# Patient Record
Sex: Male | Born: 1975 | Race: White | Hispanic: No | Marital: Married | Smoking: Current some day smoker
Health system: Southern US, Community
[De-identification: ages and names within clinical notes are randomized; demographics above are authoritative.]

## PROBLEM LIST (undated history)

## (undated) DIAGNOSIS — E1165 Type 2 diabetes mellitus with hyperglycemia: Secondary | ICD-10-CM

## (undated) DIAGNOSIS — G4733 Obstructive sleep apnea (adult) (pediatric): Secondary | ICD-10-CM

## (undated) DIAGNOSIS — E783 Hyperchylomicronemia: Secondary | ICD-10-CM

## (undated) DIAGNOSIS — K259 Gastric ulcer, unspecified as acute or chronic, without hemorrhage or perforation: Secondary | ICD-10-CM

## (undated) DIAGNOSIS — S82899A Other fracture of unspecified lower leg, initial encounter for closed fracture: Secondary | ICD-10-CM

## (undated) DIAGNOSIS — E119 Type 2 diabetes mellitus without complications: Secondary | ICD-10-CM

## (undated) DIAGNOSIS — E785 Hyperlipidemia, unspecified: Secondary | ICD-10-CM

## (undated) DIAGNOSIS — R1011 Right upper quadrant pain: Secondary | ICD-10-CM

## (undated) DIAGNOSIS — I10 Essential (primary) hypertension: Secondary | ICD-10-CM

## (undated) DIAGNOSIS — K219 Gastro-esophageal reflux disease without esophagitis: Secondary | ICD-10-CM

## (undated) HISTORY — DX: Hyperchylomicronemia: E78.3

## (undated) HISTORY — DX: Hyperlipidemia, unspecified: E78.5

## (undated) HISTORY — DX: Obstructive sleep apnea (adult) (pediatric): G47.33

## (undated) HISTORY — DX: Essential (primary) hypertension: I10

## (undated) HISTORY — PX: KNEE SURGERY: SHX244

## (undated) HISTORY — DX: Gastric ulcer, unspecified as acute or chronic, without hemorrhage or perforation: K25.9

## (undated) HISTORY — DX: Type 2 diabetes mellitus without complications: E11.9

## (undated) HISTORY — DX: Right upper quadrant pain: R10.11

## (undated) HISTORY — DX: Type 2 diabetes mellitus with hyperglycemia: E11.65

## (undated) HISTORY — DX: Gastro-esophageal reflux disease without esophagitis: K21.9

---

## 1999-11-09 HISTORY — PX: CHOLECYSTECTOMY: SHX55

## 2011-05-06 ENCOUNTER — Observation Stay: Payer: Self-pay | Admitting: Internal Medicine

## 2014-05-30 ENCOUNTER — Ambulatory Visit: Payer: Self-pay | Admitting: Gastroenterology

## 2014-05-31 LAB — PATHOLOGY REPORT

## 2014-06-26 ENCOUNTER — Ambulatory Visit: Payer: Self-pay | Admitting: Gastroenterology

## 2014-07-25 ENCOUNTER — Emergency Department: Payer: Self-pay | Admitting: Emergency Medicine

## 2014-07-25 LAB — BASIC METABOLIC PANEL
Anion Gap: 7 (ref 7–16)
BUN: 10 mg/dL (ref 7–18)
Calcium, Total: 9.2 mg/dL (ref 8.5–10.1)
Chloride: 101 mmol/L (ref 98–107)
Co2: 25 mmol/L (ref 21–32)
Creatinine: 0.89 mg/dL (ref 0.60–1.30)
EGFR (African American): >60
EGFR (Non-African Amer.): >60
Glucose: 231 mg/dL — ABNORMAL HIGH (ref 65–99)
Osmolality: 273 (ref 275–301)
Potassium: 3.9 mmol/L (ref 3.5–5.1)
Sodium: 133 mmol/L — ABNORMAL LOW (ref 136–145)

## 2014-07-25 LAB — CBC
HCT: 46.4 % (ref 40.0–52.0)
HGB: 15.4 g/dL (ref 13.0–18.0)
MCH: 30.9 pg (ref 26.0–34.0)
MCHC: 33.1 g/dL (ref 32.0–36.0)
MCV: 93 fL (ref 80–100)
PLATELETS: 191 10*3/uL (ref 150–440)
RBC: 4.98 10*6/uL (ref 4.40–5.90)
RDW: 12.7 % (ref 11.5–14.5)
WBC: 9.2 10*3/uL (ref 3.8–10.6)

## 2014-07-25 LAB — TROPONIN I

## 2014-07-25 LAB — LIPASE, BLOOD: LIPASE: 244 U/L (ref 73–393)

## 2014-07-26 LAB — HEMOGLOBIN A1C: HEMOGLOBIN A1C: 7.3 % — AB (ref 4.2–6.3)

## 2014-08-06 DIAGNOSIS — E783 Hyperchylomicronemia: Secondary | ICD-10-CM

## 2014-08-06 DIAGNOSIS — E1165 Type 2 diabetes mellitus with hyperglycemia: Secondary | ICD-10-CM

## 2014-08-06 DIAGNOSIS — Z720 Tobacco use: Secondary | ICD-10-CM | POA: Insufficient documentation

## 2014-08-06 DIAGNOSIS — R1011 Right upper quadrant pain: Secondary | ICD-10-CM

## 2014-08-06 DIAGNOSIS — I10 Essential (primary) hypertension: Secondary | ICD-10-CM

## 2014-08-06 DIAGNOSIS — E785 Hyperlipidemia, unspecified: Secondary | ICD-10-CM | POA: Insufficient documentation

## 2014-08-06 HISTORY — DX: Hyperlipidemia, unspecified: E78.5

## 2014-08-06 HISTORY — DX: Hyperchylomicronemia: E78.3

## 2014-08-06 HISTORY — DX: Essential (primary) hypertension: I10

## 2014-08-06 HISTORY — DX: Type 2 diabetes mellitus with hyperglycemia: E11.65

## 2014-08-06 HISTORY — DX: Right upper quadrant pain: R10.11

## 2014-08-20 ENCOUNTER — Ambulatory Visit: Payer: Self-pay | Admitting: Physician Assistant

## 2014-08-28 ENCOUNTER — Ambulatory Visit: Payer: Self-pay | Admitting: Physician Assistant

## 2014-09-05 DIAGNOSIS — E119 Type 2 diabetes mellitus without complications: Secondary | ICD-10-CM

## 2014-09-05 HISTORY — DX: Type 2 diabetes mellitus without complications: E11.9

## 2014-09-08 ENCOUNTER — Ambulatory Visit: Payer: Self-pay | Admitting: Physician Assistant

## 2015-12-26 ENCOUNTER — Encounter: Payer: Self-pay | Admitting: Physician Assistant

## 2016-01-13 ENCOUNTER — Ambulatory Visit (INDEPENDENT_AMBULATORY_CARE_PROVIDER_SITE_OTHER): Payer: 59 | Admitting: Urology

## 2016-01-13 ENCOUNTER — Encounter: Payer: Self-pay | Admitting: Urology

## 2016-01-13 VITALS — BP 119/79 | HR 72 | Ht 70.0 in | Wt 244.0 lb

## 2016-01-13 DIAGNOSIS — E291 Testicular hypofunction: Secondary | ICD-10-CM

## 2016-01-13 DIAGNOSIS — G4733 Obstructive sleep apnea (adult) (pediatric): Secondary | ICD-10-CM

## 2016-01-13 HISTORY — DX: Obstructive sleep apnea (adult) (pediatric): G47.33

## 2016-01-13 MED ORDER — SILDENAFIL CITRATE 20 MG PO TABS: 60.0000 mg | ORAL_TABLET | Freq: Every day | ORAL | Status: DC | PRN

## 2016-01-13 NOTE — Progress Notes (Signed)
01/13/2016 9:52 AM   Synetta Shadow [id number] [id number]  Referring provider: No referring provider defined for this encounter.  Chief Complaint  Patient presents with  . Erectile Dysfunction    New Patient    HPI: 40 year old male who is referred for erectile dysfunction. The patient has had some worsening erectile function over the past 3 or 4 years. He has in the past responded to Viagra 100 mg. However recently, he has responded less vigorously. He is able to obtain an erection using Viagra but often it is not durable enough. He rarely is able to ejaculate. The patient states that this situation is really affecting his relationship with his wife and he mentioned this leading to a divorce potentially. The patient has one 66-year-old son, they were interested in having more children potentially in the future. The patient states that he has a strong libido, does have some lethargy and fatigue symptoms. He has had his testosterone checked several times. The most recent one was 254. This was checked in the morning.   The patient was started on Wellbutrin about 2 weeks ago for depression and anxiety. The patient has had quite a bit of anxiety as it relates to his erectile dysfunction and is overall health given his young age. He has been a type II diabetic for the past 1 year, last A1c was 6.6 %.  The patient states that he walks and watches his diet, he does not do any formal exercise. He is a current smoker. His weight has been a constant struggle, however recently he has gained 10 pounds or so. The patient has obstructive sleep apnea, uses C Pap machine which he has been compliant with. He has no progressive voiding symptoms.   PMH: Past Medical History  Diagnosis Date  . BP (high blood pressure) 08/06/2014  . Obstructive apnea 01/13/2016  . Controlled type 2 diabetes mellitus without complication (HCC) 09/05/2014  . Abdominal pain, right upper quadrant 08/06/2014  . 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 HLD (hyperlipidemia) 08/06/2014  . Type 2 diabetes mellitus with hyperglycemia (HCC) 08/06/2014  . Stomach ulcer   . GERD (gastroesophageal reflux disease)     Surgical History: Past Surgical History  Procedure Laterality Date  . Cholecystectomy  2001  . Knee surgery  7829,56,21    Home Medications:    Medication List       This list is accurate as of: 01/13/16  9:52 AM.  Always use your most recent med list.               benazepril-hydrochlorthiazide 20-25 MG tablet  Commonly known as:  LOTENSIN HCT  Take 1 tablet by mouth daily.     buPROPion 150 MG 24 hr tablet  Commonly known as:  WELLBUTRIN XL  TAKE 1 TABLET (150 MG TOTAL) BY MOUTH ONCE DAILY.     fenofibrate 160 MG tablet  TAKE 1 TABLET (160 MG TOTAL) BY MOUTH ONCE DAILY.     Fish Oil 1000 MG Caps  Take by mouth.     metFORMIN 500 MG tablet  Commonly known as:  GLUCOPHAGE  TAKE ONE TAB IN MORNING AND TWO TABS AT SUPPER.     MULTIVITAMIN ADULT PO  Take by mouth.     pantoprazole 40 MG tablet  Commonly known as:  PROTONIX  TAKE 1 TABLET (40 MG TOTAL) BY MOUTH ONCE DAILY.        Allergies: No Known Allergies  Family History: Family History  Problem  Relation Age of Onset  . Adopted: Yes    Social History:  reports that he has been smoking Cigarettes.  He has been smoking about 0.25 packs per day. He does not have any smokeless tobacco history on file. He reports that he drinks alcohol. He reports that he does not use illicit drugs.  ROS: UROLOGY Frequent Urination?: No Hard to postpone urination?: No Burning/pain with urination?: No Get up at night to urinate?: No Leakage of urine?: No Urine stream starts and stops?: No Trouble starting stream?: No Do you have to strain to urinate?: No Blood in urine?: No Urinary tract infection?: No Sexually transmitted disease?: No Injury to kidneys or bladder?: No Painful intercourse?: No Weak stream?:  No Erection problems?: Yes Penile pain?: No  Gastrointestinal Nausea?: No Vomiting?: No Indigestion/heartburn?: Yes Diarrhea?: No Constipation?: No  Constitutional Fever: No Night sweats?: No Weight loss?: No Fatigue?: No  Skin Skin rash/lesions?: No Itching?: Yes  Eyes Blurred vision?: No Double vision?: No  Ears/Nose/Throat Sore throat?: No Sinus problems?: No  Hematologic/Lymphatic Swollen glands?: No Easy bruising?: No  Cardiovascular Leg swelling?: No Chest pain?: No  Respiratory Cough?: No Shortness of breath?: No  Endocrine Excessive thirst?: Yes  Musculoskeletal Back pain?: No Joint pain?: Yes  Neurological Headaches?: No Dizziness?: No  Psychologic Depression?: No Anxiety?: No  Physical Exam: BP 119/79 mmHg  Pulse 72  Ht 5\' 10"  (1.778 m)  Wt 244 lb (110.678 kg)  BMI 35.01 kg/m2  Constitutional:  Alert and oriented, No acute distress. HEENT: [place] AT, moist mucus membranes.  Trachea midline, no masses. Cardiovascular: No clubbing, cyanosis, or edema. Respiratory: Normal respiratory effort, no increased work of breathing. GI: Abdomen is soft, nontender, nondistended, no abdominal masses GU: No CVA tenderness. Testicles are normal in size, no masses palpable. The epididymis are normal to palpation. No tenderness. No scrotal lesions. Rectal exam was deferred Skin: No rashes, bruises or suspicious lesions. Lymph: No cervical or inguinal adenopathy. Neurologic: Grossly intact, no focal deficits, moving all 4 extremities. Psychiatric: Normal mood and affect.  Laboratory Data: Lab Results  Component Value Date   WBC 9.2 07/25/2014   HGB 15.4 07/25/2014   HCT 46.4 07/25/2014   MCV 93 07/25/2014   PLT 191 07/25/2014    Lab Results  Component Value Date   CREATININE 0.89 07/25/2014    No results found for: PSA  No results found for: TESTOSTERONE  Lab Results  Component Value Date   HGBA1C 7.3* 07/25/2014    Urinalysis No  results found for: COLORURINE, APPEARANCEUR, LABSPEC, PHURINE, GLUCOSEU, HGBUR, BILIRUBINUR, KETONESUR, PROTEINUR, UROBILINOGEN, NITRITE, LEUKOCYTESUR  Pertinent Imaging: None  Assessment & Plan:  The patient has erectile dysfunction which is likely multifactorial. He likely has a psychogenic component, arterial insufficiency component, and a endocrine component. Given his struggles with his relationship and his overall degree of frustration, I think it is worth pursuing all 3 modalities.  So, in order to get insurance to cover any form of testosterone therapy we will need to obtain a second level that is low. In addition, I have ordered other labs to violate the entire endocrine access. I also added an estrogen to see how much his obesity is playing into his low testosterone. Given that the patient is still interested in having children, we discussed the best mode of therapy which in him would likely be Clomid or aromatase inhibitors. I did caution him that this may not lead to any erectile function improvement but we could certainly try. In  addition to medical therapy I urged the patient to begin a structured and regimented exercise routine so that he is able to get at least 30 minutes of cardiovascular exercise 3 or 4 times a week. Losing weight would help his confidence, reduce the estrogen levels, and improve his overall well-being. Smoking cessation is also a big factor and I encouraged him to continue weaning himself tobacco. And finally, I recommended the patient used silk then a fill to help augment his erections. I sent a prescription to the pharmacy for him and recommend that he take between 60 and 100 mg of sildenafil an hour before or after food. We also discussed the proper use of sildenafil in terms of timing. I also explained to him the side effects which she is familiar with.   1. Hypogonadism in male - we will check the labs listed and if the patient is truly hypogonadal primarily I  recommend that we start the patient on Clomid 50 mg daily. We'll then follow up with him in 6 weeks with labs prior. Again, sildenafil was sent to the pharmacy for him as well. - Testosterone; Future - FSH/LH; Future - Prolactin; Future - Hemoglobin; Future - Estradiol; Future - PSA; Future   Return in about 6 weeks (around 02/24/2016) for f/u of hypogonadism.  Crist Fat, MD  [facility] [address] [place], [place] [address] [phone]

## 2016-01-15 ENCOUNTER — Other Ambulatory Visit: Payer: 59

## 2016-01-15 DIAGNOSIS — E291 Testicular hypofunction: Secondary | ICD-10-CM

## 2016-01-16 LAB — TESTOSTERONE: TESTOSTERONE: 169 ng/dL — AB (ref 348–1197)

## 2016-01-16 LAB — HEMOGLOBIN: Hemoglobin: 15.6 g/dL (ref 12.6–17.7)

## 2016-01-16 LAB — FSH/LH
FSH: 6.6 m[IU]/mL (ref 1.5–12.4)
LH: 7.1 m[IU]/mL (ref 1.7–8.6)

## 2016-01-16 LAB — PROLACTIN: PROLACTIN: 10.2 ng/mL (ref 4.0–15.2)

## 2016-01-16 LAB — ESTRADIOL: Estradiol: 10.4 pg/mL (ref 7.6–42.6)

## 2016-01-16 LAB — PSA: Prostate Specific Ag, Serum: 0.7 ng/mL (ref 0.0–4.0)

## 2016-01-21 ENCOUNTER — Telehealth: Payer: Self-pay | Admitting: Urology

## 2016-01-21 NOTE — Telephone Encounter (Signed)
Patient was seen on 01-15-16 and is still waiting on his lab results. He would like a call back today please.  Thanks  michelle

## 2016-01-22 ENCOUNTER — Telehealth: Payer: Self-pay

## 2016-01-22 DIAGNOSIS — E291 Testicular hypofunction: Secondary | ICD-10-CM

## 2016-01-22 MED ORDER — CLOMIPHENE CITRATE 50 MG PO TABS
50.0000 mg | ORAL_TABLET | Freq: Every day | ORAL | Status: DC
Start: 2016-01-22 — End: 2016-07-15

## 2016-01-22 NOTE — Telephone Encounter (Signed)
Spoke with pt in reference to labs and testosterone. Med[facility] been [facility]. Pt has been made aware.

## 2016-01-22 NOTE — Telephone Encounter (Signed)
-----   Message from Crist FatBenjamin W Herrick, MD sent at 01/21/2016  7:52 PM EDT ----- Regarding: testosterone labs Please call patient and give him the results of his most recent lab work.  His testosterone is low, the other labs looked okay.  Please send a prescription for him to start clomid 50mg  daily.  He should then follow-up with repeat labs in 6 weeks. Thank you, Cordella RegisterBen Herrick

## 2016-02-27 ENCOUNTER — Other Ambulatory Visit: Payer: 59

## 2016-03-02 ENCOUNTER — Ambulatory Visit: Payer: 59

## 2016-03-03 ENCOUNTER — Other Ambulatory Visit: Payer: Self-pay

## 2016-03-03 DIAGNOSIS — E291 Testicular hypofunction: Secondary | ICD-10-CM

## 2016-03-04 ENCOUNTER — Other Ambulatory Visit: Payer: Self-pay

## 2016-03-04 DIAGNOSIS — E291 Testicular hypofunction: Secondary | ICD-10-CM

## 2016-03-05 LAB — TESTOSTERONE: Testosterone: 589 ng/dL (ref 348–1197)

## 2016-03-08 ENCOUNTER — Encounter: Payer: Self-pay | Admitting: Urology

## 2016-03-08 ENCOUNTER — Ambulatory Visit: Payer: 59 | Admitting: Urology

## 2016-07-15 ENCOUNTER — Encounter
Admission: RE | Admit: 2016-07-15 | Discharge: 2016-07-15 | Disposition: A | Discharge status: Home / Self Care | Payer: BLUE CROSS/BLUE SHIELD | Source: Ambulatory Visit | Attending: Surgery | Admitting: Surgery

## 2016-07-15 DIAGNOSIS — I10 Essential (primary) hypertension: Secondary | ICD-10-CM | POA: Diagnosis not present

## 2016-07-15 DIAGNOSIS — Z01810 Encounter for preprocedural cardiovascular examination: Secondary | ICD-10-CM | POA: Diagnosis not present

## 2016-07-15 HISTORY — DX: Other fracture of unspecified lower leg, initial encounter for closed fracture: S82.899A

## 2016-07-15 NOTE — Patient Instructions (Signed)
  Your procedure is scheduled on: 07/23/16 Friday Report to Day Surgery. To find out your arrival time please call [phone] between 1PM - 3PM on Thurs. 07/22/16.  Remember: Instructions that are not followed completely may result in serious medical risk, up to and including death, or upon the discretion of your surgeon and anesthesiologist your surgery may need to be rescheduled.    __x__ 1. Do not eat food or drink liquids after midnight. No gum chewing or hard candies.     __x__ 2. No Alcohol for 24 hours before or after surgery.   __x__ 3. Do Not Smoke For 24 Hours Prior to Your Surgery.   ____ 4. Bring all medications with you on the day of surgery if instructed.    __x__ 5. Notify your doctor if there is any change in your medical condition     (cold, fever, infections).       Do not wear jewelry, make-up, hairpins, clips or nail polish.  Do not wear lotions, powders, or perfumes. You may wear deodorant.  Do not shave 48 hours prior to surgery. Men may shave face and neck.  Do not bring valuables to the hospital.    [facility] is not responsible for any belongings or valuables.               Contacts, dentures or bridgework may not be worn into surgery.  Leave your suitcase in the car. After surgery it may be brought to your room.  For patients admitted to the hospital, discharge time is determined by your                treatment team.   Patients discharged the day of surgery will not be allowed to drive home.   Please read over the following fact sheets that you were given:   Surgical Site Infection Prevention   _x___ Take these medicines the morning of surgery with A SIP OF WATER:    1. benazepril-hydrochlorthiazide (LOTENSIN HCT) 20-25 MG tablet (Bring to hospital)  2. buPROPion (WELLBUTRIN XL) 150 MG 24 hr tablet  3. pantoprazole (PROTONIX) 40 MG tablet (Take extra dose the night before)  4.  5.  6.  ____ Fleet Enema (as directed)   ____ Use CHG Soap as  directed  ____ Use inhalers on the day of surgery  __x__ Stop metformin 2 days prior to surgery    ____ Take 1/2 of usual insulin dose the night before surgery and none on the morning of surgery.   ____ Stop Coumadin/Plavix/aspirin   __x__ Stop Anti-inflammatories on tylenol only   __x__ Stop supplements until after surgery.  Stop fish oil tomorrow  __x__ Bring C-Pap to the hospital.

## 2016-07-23 ENCOUNTER — Ambulatory Visit
Admission: RE | Admit: 2016-07-23 | Discharge: 2016-07-23 | Disposition: A | Discharge status: Home / Self Care | Payer: BLUE CROSS/BLUE SHIELD | Source: Ambulatory Visit | Attending: Surgery | Admitting: Surgery

## 2016-07-23 ENCOUNTER — Ambulatory Visit: Payer: BLUE CROSS/BLUE SHIELD | Admitting: Anesthesiology

## 2016-07-23 ENCOUNTER — Encounter: Payer: Self-pay | Admitting: *Deleted

## 2016-07-23 ENCOUNTER — Encounter
Admission: RE | Disposition: A | Discharge status: Home / Self Care | Payer: Self-pay | Source: Ambulatory Visit | Attending: Surgery

## 2016-07-23 DIAGNOSIS — G4733 Obstructive sleep apnea (adult) (pediatric): Secondary | ICD-10-CM | POA: Diagnosis not present

## 2016-07-23 DIAGNOSIS — Z79899 Other long term (current) drug therapy: Secondary | ICD-10-CM | POA: Diagnosis not present

## 2016-07-23 DIAGNOSIS — F419 Anxiety disorder, unspecified: Secondary | ICD-10-CM | POA: Diagnosis not present

## 2016-07-23 DIAGNOSIS — K601 Chronic anal fissure: Secondary | ICD-10-CM | POA: Insufficient documentation

## 2016-07-23 DIAGNOSIS — E119 Type 2 diabetes mellitus without complications: Secondary | ICD-10-CM | POA: Insufficient documentation

## 2016-07-23 DIAGNOSIS — Z9049 Acquired absence of other specified parts of digestive tract: Secondary | ICD-10-CM | POA: Diagnosis not present

## 2016-07-23 DIAGNOSIS — F17200 Nicotine dependence, unspecified, uncomplicated: Secondary | ICD-10-CM | POA: Insufficient documentation

## 2016-07-23 DIAGNOSIS — Z6837 Body mass index (BMI) 37.0-37.9, adult: Secondary | ICD-10-CM | POA: Diagnosis not present

## 2016-07-23 DIAGNOSIS — E785 Hyperlipidemia, unspecified: Secondary | ICD-10-CM | POA: Insufficient documentation

## 2016-07-23 DIAGNOSIS — I10 Essential (primary) hypertension: Secondary | ICD-10-CM | POA: Diagnosis not present

## 2016-07-23 DIAGNOSIS — Z7984 Long term (current) use of oral hypoglycemic drugs: Secondary | ICD-10-CM | POA: Insufficient documentation

## 2016-07-23 DIAGNOSIS — E669 Obesity, unspecified: Secondary | ICD-10-CM | POA: Diagnosis not present

## 2016-07-23 HISTORY — PX: SPHINCTEROTOMY: SHX5279

## 2016-07-23 LAB — GLUCOSE, CAPILLARY
GLUCOSE-CAPILLARY: 189 mg/dL — AB (ref 65–99)
Glucose-Capillary: 199 mg/dL — ABNORMAL HIGH (ref 65–99)

## 2016-07-23 SURGERY — SPHINCTEROTOMY, ANAL
Anesthesia: General | Site: Rectum | Wound class: Clean Contaminated

## 2016-07-23 MED ORDER — ROCURONIUM BROMIDE 100 MG/10ML IV SOLN
INTRAVENOUS | Status: DC | PRN
  Administered 2016-07-23: 25 mg via INTRAVENOUS
  Administered 2016-07-23: 10 mg via INTRAVENOUS
  Administered 2016-07-23: 5 mg via INTRAVENOUS

## 2016-07-23 MED ORDER — BUPIVACAINE-EPINEPHRINE (PF) 0.5% -1:200000 IJ SOLN
INTRAMUSCULAR | Status: DC | PRN
  Administered 2016-07-23: 5 mL via PERINEURAL

## 2016-07-23 MED ORDER — SUCCINYLCHOLINE CHLORIDE 20 MG/ML IJ SOLN
INTRAMUSCULAR | Status: DC | PRN
  Administered 2016-07-23: 120 mg via INTRAVENOUS

## 2016-07-23 MED ORDER — BUPIVACAINE-EPINEPHRINE (PF) 0.5% -1:200000 IJ SOLN
INTRAMUSCULAR | Status: AC
  Filled 2016-07-23: qty 30

## 2016-07-23 MED ORDER — OXYCODONE HCL 5 MG PO TABS
ORAL_TABLET | ORAL | Status: AC
  Filled 2016-07-23: qty 1

## 2016-07-23 MED ORDER — FENTANYL CITRATE (PF) 100 MCG/2ML IJ SOLN
INTRAMUSCULAR | Status: AC
  Administered 2016-07-23: 50 ug
  Filled 2016-07-23: qty 2

## 2016-07-23 MED ORDER — NEOSTIGMINE METHYLSULFATE 10 MG/10ML IV SOLN
INTRAVENOUS | Status: DC | PRN
  Administered 2016-07-23: 3 mg via INTRAVENOUS

## 2016-07-23 MED ORDER — HYDROCODONE-ACETAMINOPHEN 5-325 MG PO TABS: 1.0000 | ORAL_TABLET | ORAL | Status: DC | PRN

## 2016-07-23 MED ORDER — FENTANYL CITRATE (PF) 100 MCG/2ML IJ SOLN
INTRAMUSCULAR | Status: DC | PRN
  Administered 2016-07-23: 50 ug via INTRAVENOUS

## 2016-07-23 MED ORDER — FENTANYL CITRATE (PF) 100 MCG/2ML IJ SOLN
INTRAMUSCULAR | Status: AC
  Filled 2016-07-23: qty 2

## 2016-07-23 MED ORDER — GLYCOPYRROLATE 0.2 MG/ML IJ SOLN
INTRAMUSCULAR | Status: DC | PRN
  Administered 2016-07-23: 0.6 mg via INTRAVENOUS

## 2016-07-23 MED ORDER — FENTANYL CITRATE (PF) 100 MCG/2ML IJ SOLN
25.0000 ug | INTRAMUSCULAR | Status: DC | PRN
  Administered 2016-07-23 (×2): 50 ug via INTRAVENOUS

## 2016-07-23 MED ORDER — MEPERIDINE HCL 25 MG/ML IJ SOLN: 6.2500 mg | INTRAMUSCULAR | Status: DC | PRN

## 2016-07-23 MED ORDER — OXYCODONE HCL 5 MG PO TABS
5.0000 mg | ORAL_TABLET | Freq: Once | ORAL | Status: AC | PRN
  Administered 2016-07-23: 5 mg via ORAL

## 2016-07-23 MED ORDER — PROPOFOL 10 MG/ML IV BOLUS
INTRAVENOUS | Status: DC | PRN
  Administered 2016-07-23: 200 mg via INTRAVENOUS

## 2016-07-23 MED ORDER — ONDANSETRON HCL 4 MG/2ML IJ SOLN
INTRAMUSCULAR | Status: DC | PRN
  Administered 2016-07-23: 4 mg via INTRAVENOUS

## 2016-07-23 MED ORDER — HYDROCODONE-ACETAMINOPHEN 5-325 MG PO TABS: 1.0000 | ORAL_TABLET | ORAL | 0 refills | Status: DC | PRN

## 2016-07-23 MED ORDER — PROMETHAZINE HCL 25 MG/ML IJ SOLN: 6.2500 mg | INTRAMUSCULAR | Status: DC | PRN

## 2016-07-23 MED ORDER — SODIUM CHLORIDE 0.9 % IV SOLN
INTRAVENOUS | Status: DC
  Administered 2016-07-23: 10:00:00 via INTRAVENOUS

## 2016-07-23 MED ORDER — OXYCODONE HCL 5 MG/5ML PO SOLN: 5.0000 mg | Freq: Once | ORAL | Status: AC | PRN

## 2016-07-23 MED ORDER — LIDOCAINE HCL (CARDIAC) 20 MG/ML IV SOLN
INTRAVENOUS | Status: DC | PRN
  Administered 2016-07-23: 40 mg via INTRAVENOUS

## 2016-07-23 MED ORDER — MIDAZOLAM HCL 2 MG/2ML IJ SOLN
INTRAMUSCULAR | Status: DC | PRN
  Administered 2016-07-23: 1.5 mg via INTRAVENOUS
  Administered 2016-07-23: 0.5 mg via INTRAVENOUS

## 2016-07-23 SURGICAL SUPPLY — 26 items
BLADE SURG 15 STRL LF DISP TIS (BLADE) ×1 IMPLANT
BLADE SURG 15 STRL SS (BLADE) ×2
CANISTER SUCT 1200ML W/VALVE (MISCELLANEOUS) ×3 IMPLANT
DRAPE LAPAROTOMY 100X77 ABD (DRAPES) ×3 IMPLANT
DRAPE LEGGINS SURG 28X43 STRL (DRAPES) ×3 IMPLANT
DRAPE UNDER BUTTOCK W/FLU (DRAPES) ×3 IMPLANT
ELECT REM PT RETURN 9FT ADLT (ELECTROSURGICAL) ×3
ELECTRODE REM PT RTRN 9FT ADLT (ELECTROSURGICAL) ×1 IMPLANT
GAUZE SPONGE 4X4 12PLY STRL (GAUZE/BANDAGES/DRESSINGS) ×3 IMPLANT
GLOVE BIO SURGEON STRL SZ7.5 (GLOVE) ×3 IMPLANT
GOWN STRL REUS W/ TWL LRG LVL3 (GOWN DISPOSABLE) ×2 IMPLANT
GOWN STRL REUS W/TWL LRG LVL3 (GOWN DISPOSABLE) ×4
KIT RM TURNOVER CYSTO AR (KITS) ×3 IMPLANT
LABEL OR SOLS (LABEL) ×3 IMPLANT
NEEDLE HYPO 25X1 1.5 SAFETY (NEEDLE) ×3 IMPLANT
NS IRRIG 500ML POUR BTL (IV SOLUTION) ×3 IMPLANT
PACK BASIN MINOR ARMC (MISCELLANEOUS) ×3 IMPLANT
PAD PREP 24X41 OB/GYN DISP (PERSONAL CARE ITEMS) ×3 IMPLANT
SOL PREP PVP 2OZ (MISCELLANEOUS) ×3
SOLUTION PREP PVP 2OZ (MISCELLANEOUS) ×1 IMPLANT
SURGILUBE 2OZ TUBE FLIPTOP (MISCELLANEOUS) ×3 IMPLANT
SUT CHROMIC 3 0 SH 27 (SUTURE) ×3 IMPLANT
SUT CHROMIC 5 0 RB 1 27 (SUTURE) ×6 IMPLANT
SUT VIC AB 3-0 SH 27 (SUTURE) ×2
SUT VIC AB 3-0 SH 27X BRD (SUTURE) ×1 IMPLANT
SYRINGE 10CC LL (SYRINGE) ×3 IMPLANT

## 2016-07-23 NOTE — Anesthesia Procedure Notes (Signed)
Procedure Name: Intubation Date/Time: 07/23/2016 9:54 AM Performed by: Kevin BusmanIAMOND, Kevin Maynard Pre-anesthesia Checklist: Patient identified, Patient being monitored, Timeout performed, Emergency Drugs available and Suction available Patient Re-evaluated:Patient Re-evaluated prior to inductionOxygen Delivery Method: Circle system utilized Preoxygenation: Pre-oxygenation with 100% oxygen Intubation Type: IV induction and Combination inhalational/ intravenous induction Ventilation: Mask ventilation without difficulty Laryngoscope Size: 3 and Miller Grade View: Grade II Tube type: Oral Tube size: 7.5 mm Number of attempts: 1 Airway Equipment and Method: Stylet Placement Confirmation: ETT inserted through vocal cords under direct vision,  positive ETCO2 and breath sounds checked- equal and bilateral Secured at: 22 cm Tube secured with: Tape Dental Injury: Teeth and Oropharynx as per pre-operative assessment

## 2016-07-23 NOTE — Anesthesia Postprocedure Evaluation (Signed)
Anesthesia Post Note  Patient: Kevin Maynard  Procedure(s) Performed: Procedure(s) (LRB): LATERAL INTERNAL ANAL SPHINCTEROTOMY (N/A)  Patient location during evaluation: PACU Anesthesia Type: General Level of consciousness: awake and alert and oriented Pain management: pain level controlled Vital Signs Assessment: post-procedure vital signs reviewed and stable Respiratory status: spontaneous breathing, nonlabored ventilation and respiratory function stable Cardiovascular status: blood pressure returned to baseline and stable Postop Assessment: no signs of nausea or vomiting Anesthetic complications: no    Last Vitals:  Vitals:   07/23/16 1045 07/23/16 1140  BP: 138/77 134/73  Pulse: 69 67  Resp: 19 (!) 8  Temp: (!) 36 C 36.4 C    Last Pain:  Vitals:   07/23/16 1142  TempSrc:   PainSc: 4                  Meshawn Oconnor

## 2016-07-23 NOTE — Anesthesia Preprocedure Evaluation (Signed)
Anesthesia Evaluation  Patient identified by MRN, date of birth, ID band Patient awake    Reviewed: Allergy & Precautions, NPO status , Patient's Chart, lab work & pertinent test results  History of Anesthesia Complications Negative for: history of anesthetic complications  Airway Mallampati: II  TM Distance: >3 FB Neck ROM: Full    Dental  (+) Implants   Pulmonary sleep apnea and Continuous Positive Airway Pressure Ventilation , neg COPD, Current Smoker (occasional cigar),    breath sounds clear to auscultation- rhonchi (-) wheezing      Cardiovascular Exercise Tolerance: Good hypertension, Pt. on medications (-) CAD and (-) Past MI  Rhythm:Regular Rate:Normal     Neuro/Psych negative neurological ROS  negative psych ROS   GI/Hepatic Neg liver ROS, PUD, GERD  Controlled,  Endo/Other  diabetes, Type 2, Oral Hypoglycemic Agents  Renal/GU negative Renal ROS     Musculoskeletal negative musculoskeletal ROS (+)   Abdominal (+) + obese,   Peds  Hematology negative hematology ROS (+)   Anesthesia Other Findings   Reproductive/Obstetrics                             Anesthesia Physical Anesthesia Plan  ASA: II  Anesthesia Plan: General   Post-op Pain Management:    Induction: Intravenous  Airway Management Planned: Oral ETT  Additional Equipment:   Intra-op Plan:   Post-operative Plan: Extubation in OR  Informed Consent: I have reviewed the patients History and Physical, chart, labs and discussed the procedure including the risks, benefits and alternatives for the proposed anesthesia with the patient or authorized representative who has indicated his/her understanding and acceptance.   Dental advisory given  Plan Discussed with: CRNA and Anesthesiologist  Anesthesia Plan Comments:         Anesthesia Quick Evaluation

## 2016-07-23 NOTE — Discharge Instructions (Signed)
Take Tylenol or Norco if needed for pain.  Should not drive or do anything dangerous when taking Norco.  Remove dressing later today or tomorrow. May then shower and/or sitting in warm water.  Tuck gauze or pad in underwear as needed for drainage.  Resume usual activities as tolerated.       General Anesthesia, Adult General anesthesia is a sleep-like state of non-feeling produced by medicines (anesthetics). General anesthesia prevents you from being alert and feeling pain during a medical procedure. Your caregiver may recommend general anesthesia if your procedure:  Is long.  Is painful or uncomfortable.  Would be frightening to see or hear.  Requires you to be still.  Affects your breathing.  Causes significant blood loss. LET YOUR CAREGIVER KNOW ABOUT:  Allergies to food or medicine.  Medicines taken, including vitamins, herbs, eyedrops, over-the-counter medicines, and creams.  Use of steroids (by mouth or creams).  Previous problems with anesthetics or numbing medicines, including problems experienced by relatives.  History of bleeding problems or blood clots.  Previous surgeries and types of anesthetics received.  Possibility of pregnancy, if this applies.  Use of cigarettes, alcohol, or illegal drugs.  Any health condition(s), especially diabetes, sleep apnea, and high blood pressure. RISKS AND COMPLICATIONS General anesthesia rarely causes complications. However, if complications do occur, they can be life threatening. Complications include:  A lung infection.  A stroke.  A heart attack.  Waking up during the procedure. When this occurs, the patient may be unable to move and communicate that he or she is awake. The patient may feel severe pain. Older adults and adults with serious medical problems are more likely to have complications than adults who are young and healthy. Some complications can be prevented by answering all of your caregiver's  questions thoroughly and by following all pre-procedure instructions. It is important to tell your caregiver if any of the pre-procedure instructions, especially those related to diet, were not followed. Any food or liquid in the stomach can cause problems when you are under general anesthesia. BEFORE THE PROCEDURE  Ask your caregiver if you will have to spend the night at the hospital. If you will not have to spend the night, arrange to have an adult drive you and stay with you for 24 hours.  Follow your caregiver's instructions if you are taking dietary supplements or medicines. Your caregiver may tell you to stop taking them or to reduce your dosage.  Do not smoke for as long as possible before your procedure. If possible, stop smoking 3-6 weeks before the procedure.  Do not take new dietary supplements or medicines within 1 week of your procedure unless your caregiver approves them.  Do not eat within 8 hours of your procedure or as directed by your caregiver. Drink only clear liquids, such as water, black coffee (without milk or cream), and fruit juices (without pulp).  Do not drink within 3 hours of your procedure or as directed by your caregiver.  You may brush your teeth on the morning of the procedure, but make sure to spit out the toothpaste and water when finished. PROCEDURE  You will receive anesthetics through a mask, through an intravenous (IV) access tube, or through both. A doctor who specializes in anesthesia (anesthesiologist) or a nurse who specializes in anesthesia (nurse anesthetist) or both will stay with you throughout the procedure to make sure you remain unconscious. He or she will also watch your blood pressure, pulse, and oxygen levels to make sure that  the anesthetics do not cause any problems. Once you are asleep, a breathing tube or mask may be used to help you breathe. AFTER THE PROCEDURE You will wake up after the procedure is complete. You may be in the room where  the procedure was performed or in a recovery area. You may have a sore throat if a breathing tube was used. You may also feel:  Dizzy.  Weak.  Drowsy.  Confused.  Nauseous.  Cold. These are all normal responses and can be expected to last for up to 24 hours after the procedure is complete. A caregiver will tell you when you are ready to go home. This will usually be when you are fully awake and in stable condition.   This information is not intended to replace advice given to you by your health care provider. Make sure you discuss any questions you have with your health care provider.   Document Released: 02/01/2008 Document Revised: 11/15/2014 Document Reviewed: 02/23/2012 Elsevier Interactive Patient Education [employer]

## 2016-07-23 NOTE — Op Note (Signed)
OPERATIVE REPORT  PREOPERATIVE  DIAGNOSIS: . Chronic anal fissure  POSTOPERATIVE DIAGNOSIS: . Chronic anal fissure  PROCEDURE: . Lateral internal anal sphincterotomy  ANESTHESIA:  General  SURGEON: Renda RollsWilton Orvill Coulthard  MD   INDICATIONS: . He has a history of intermittent anorectal pain associated with posterior anal fissure. Surgery was recommended for definitive treatment  With the patient on the operating table in the supine position under general anesthesia the legs were elevated into the lithotomy position using ankle straps. The scrotum was retracted with paper tape. The anal area was prepared with Betadine and draped in a sterile manner. The skin on the patient's left side was infiltrated with half percent Sensorcaine with epinephrine. A short incision was made at the 3:00 position and dissection was carried down into subcutaneous tissues. Several small bleeding points were cauterized. The anal canal was dilated large enough to admit 4 fingers. The bivalve anal retractor was introduced. The internal anal sphincter was identified by its white color. 90% of the sphincter was divided with electrocautery. Bleeding was minimal and hemostasis surgically intact. The skin edges were loosely approximated with 5-0 chromic simple sutures leaving a small opening distally for drainage. Dressings were applied with paper tape  The patient tolerated surgery satisfactorily and was then prepared for transfer to the recovery room  Renda RollsWilton Sharae Zappulla M.D.

## 2016-07-23 NOTE — H&P (Signed)
  He reports no change in condition since the office visit. Her lab work noted.  I discussed the plan for a lateral internal anal sphincterotomy. Also discussed the plans for follow-up.

## 2016-07-23 NOTE — Transfer of Care (Signed)
Immediate Anesthesia Transfer of Care Note  Patient: Kevin Maynard  Procedure(s) Performed: Procedure(s): LATERAL INTERNAL ANAL SPHINCTEROTOMY (N/A)  Patient Location: PACU  Anesthesia Type:General  Level of Consciousness: awake and patient cooperative  Airway & Oxygen Therapy: Patient Spontanous Breathing and Patient connected to face mask oxygen  Post-op Assessment: Report given to RN and Post -op Vital signs reviewed and stable  Post vital signs: stable  Last Vitals:  Vitals:   07/23/16 0912  BP: 116/82  Pulse: 98  Resp: 16  Temp: 36.8 C    Last Pain:  Vitals:   07/23/16 0912  TempSrc: Oral         Complications: No apparent anesthesia complications

## 2016-07-29 ENCOUNTER — Encounter: Payer: Self-pay | Admitting: Surgery

## 2017-07-29 ENCOUNTER — Inpatient Hospital Stay
Admission: EM | Admit: 2017-07-29 | Discharge: 2017-08-01 | DRG: 637 | Disposition: A | Discharge status: Home / Self Care | Payer: BLUE CROSS/BLUE SHIELD | Attending: Internal Medicine | Admitting: Internal Medicine

## 2017-07-29 ENCOUNTER — Ambulatory Visit
Admission: RE | Admit: 2017-07-29 | Discharge: 2017-07-29 | Disposition: A | Discharge status: Home / Self Care | Payer: BLUE CROSS/BLUE SHIELD | Source: Ambulatory Visit | Attending: Physician Assistant | Admitting: Physician Assistant

## 2017-07-29 ENCOUNTER — Encounter: Payer: Self-pay | Admitting: Emergency Medicine

## 2017-07-29 ENCOUNTER — Other Ambulatory Visit: Payer: Self-pay | Admitting: Physician Assistant

## 2017-07-29 DIAGNOSIS — Z9049 Acquired absence of other specified parts of digestive tract: Secondary | ICD-10-CM

## 2017-07-29 DIAGNOSIS — E781 Pure hyperglyceridemia: Secondary | ICD-10-CM | POA: Diagnosis present

## 2017-07-29 DIAGNOSIS — E1110 Type 2 diabetes mellitus with ketoacidosis without coma: Principal | ICD-10-CM | POA: Diagnosis present

## 2017-07-29 DIAGNOSIS — R109 Unspecified abdominal pain: Secondary | ICD-10-CM

## 2017-07-29 DIAGNOSIS — F17210 Nicotine dependence, cigarettes, uncomplicated: Secondary | ICD-10-CM | POA: Diagnosis present

## 2017-07-29 DIAGNOSIS — I10 Essential (primary) hypertension: Secondary | ICD-10-CM | POA: Diagnosis present

## 2017-07-29 DIAGNOSIS — Z6834 Body mass index (BMI) 34.0-34.9, adult: Secondary | ICD-10-CM | POA: Diagnosis not present

## 2017-07-29 DIAGNOSIS — E876 Hypokalemia: Secondary | ICD-10-CM | POA: Diagnosis present

## 2017-07-29 DIAGNOSIS — Z79899 Other long term (current) drug therapy: Secondary | ICD-10-CM | POA: Diagnosis not present

## 2017-07-29 DIAGNOSIS — E1010 Type 1 diabetes mellitus with ketoacidosis without coma: Secondary | ICD-10-CM | POA: Diagnosis not present

## 2017-07-29 DIAGNOSIS — E785 Hyperlipidemia, unspecified: Secondary | ICD-10-CM | POA: Diagnosis present

## 2017-07-29 DIAGNOSIS — R1010 Upper abdominal pain, unspecified: Secondary | ICD-10-CM | POA: Diagnosis present

## 2017-07-29 DIAGNOSIS — G4733 Obstructive sleep apnea (adult) (pediatric): Secondary | ICD-10-CM | POA: Diagnosis present

## 2017-07-29 DIAGNOSIS — R748 Abnormal levels of other serum enzymes: Secondary | ICD-10-CM | POA: Diagnosis present

## 2017-07-29 DIAGNOSIS — K279 Peptic ulcer, site unspecified, unspecified as acute or chronic, without hemorrhage or perforation: Secondary | ICD-10-CM | POA: Diagnosis present

## 2017-07-29 DIAGNOSIS — E871 Hypo-osmolality and hyponatremia: Secondary | ICD-10-CM | POA: Diagnosis present

## 2017-07-29 DIAGNOSIS — Z7984 Long term (current) use of oral hypoglycemic drugs: Secondary | ICD-10-CM

## 2017-07-29 DIAGNOSIS — R319 Hematuria, unspecified: Secondary | ICD-10-CM | POA: Diagnosis present

## 2017-07-29 DIAGNOSIS — K859 Acute pancreatitis without necrosis or infection, unspecified: Secondary | ICD-10-CM

## 2017-07-29 DIAGNOSIS — E870 Hyperosmolality and hypernatremia: Secondary | ICD-10-CM | POA: Diagnosis present

## 2017-07-29 DIAGNOSIS — E1165 Type 2 diabetes mellitus with hyperglycemia: Secondary | ICD-10-CM

## 2017-07-29 DIAGNOSIS — K858 Other acute pancreatitis without necrosis or infection: Secondary | ICD-10-CM

## 2017-07-29 DIAGNOSIS — K219 Gastro-esophageal reflux disease without esophagitis: Secondary | ICD-10-CM | POA: Diagnosis present

## 2017-07-29 DIAGNOSIS — Z9114 Patient's other noncompliance with medication regimen: Secondary | ICD-10-CM

## 2017-07-29 DIAGNOSIS — E875 Hyperkalemia: Secondary | ICD-10-CM | POA: Diagnosis present

## 2017-07-29 LAB — BLOOD GAS, ARTERIAL
Acid-base deficit: 1.3 mmol/L (ref 0.0–2.0)
Bicarbonate: 23.7 mmol/L (ref 20.0–28.0)
FIO2: 0.21
O2 Saturation: 95.6 %
PATIENT TEMPERATURE: 37
PH ART: 7.38 (ref 7.350–7.450)
PO2 ART: 81 mmHg — AB (ref 83.0–108.0)
pCO2 arterial: 40 mmHg (ref 32.0–48.0)

## 2017-07-29 LAB — CBC
HCT: 43.6 % (ref 40.0–52.0)
Hemoglobin: 15.8 g/dL (ref 13.0–18.0)
MCH: 31.8 pg (ref 26.0–34.0)
MCHC: 36.2 g/dL — ABNORMAL HIGH (ref 32.0–36.0)
MCV: 87.8 fL (ref 80.0–100.0)
Platelets: 258 10*3/uL (ref 150–440)
RBC: 4.97 MIL/uL (ref 4.40–5.90)
RDW: 12.6 % (ref 11.5–14.5)
WBC: 14.2 10*3/uL — AB (ref 3.8–10.6)

## 2017-07-29 LAB — COMPREHENSIVE METABOLIC PANEL
ALK PHOS: 62 U/L (ref 38–126)
ALT: 35 U/L (ref 17–63)
ANION GAP: 18 — AB (ref 5–15)
AST: 25 U/L (ref 15–41)
Albumin: 4.3 g/dL (ref 3.5–5.0)
BUN: 13 mg/dL (ref 6–20)
CALCIUM: 9.3 mg/dL (ref 8.9–10.3)
CHLORIDE: 88 mmol/L — AB (ref 101–111)
CO2: 23 mmol/L (ref 22–32)
Creatinine, Ser: 1.11 mg/dL (ref 0.61–1.24)
Glucose, Bld: 294 mg/dL — ABNORMAL HIGH (ref 65–99)
Potassium: 3.6 mmol/L (ref 3.5–5.1)
SODIUM: 129 mmol/L — AB (ref 135–145)
Total Bilirubin: 1.3 mg/dL — ABNORMAL HIGH (ref 0.3–1.2)
Total Protein: 7.8 g/dL (ref 6.5–8.1)

## 2017-07-29 LAB — URINALYSIS, COMPLETE (UACMP) WITH MICROSCOPIC
Bilirubin Urine: NEGATIVE
Ketones, ur: 20 mg/dL — AB
LEUKOCYTES UA: NEGATIVE
Nitrite: NEGATIVE
PH: 5 (ref 5.0–8.0)
Protein, ur: 100 mg/dL — AB
SPECIFIC GRAVITY, URINE: 1.033 — AB (ref 1.005–1.030)
Squamous Epithelial / LPF: NONE SEEN

## 2017-07-29 LAB — BASIC METABOLIC PANEL
ANION GAP: 14 (ref 5–15)
BUN: 12 mg/dL (ref 6–20)
CALCIUM: 8.6 mg/dL — AB (ref 8.9–10.3)
CO2: 23 mmol/L (ref 22–32)
Chloride: 94 mmol/L — ABNORMAL LOW (ref 101–111)
Creatinine, Ser: 0.84 mg/dL (ref 0.61–1.24)
GFR calc non Af Amer: >60 mL/min (ref >60)
Glucose, Bld: 266 mg/dL — ABNORMAL HIGH (ref 65–99)
POTASSIUM: 3.3 mmol/L — AB (ref 3.5–5.1)
Sodium: 131 mmol/L — ABNORMAL LOW (ref 135–145)

## 2017-07-29 LAB — GLUCOSE, CAPILLARY
GLUCOSE-CAPILLARY: 275 mg/dL — AB (ref 65–99)
GLUCOSE-CAPILLARY: 243 mg/dL — AB (ref 65–99)
Glucose-Capillary: 225 mg/dL — ABNORMAL HIGH (ref 65–99)
Glucose-Capillary: 232 mg/dL — ABNORMAL HIGH (ref 65–99)
Glucose-Capillary: 259 mg/dL — ABNORMAL HIGH (ref 65–99)

## 2017-07-29 LAB — BETA-HYDROXYBUTYRIC ACID: Beta-Hydroxybutyric Acid: 3.4 mmol/L — ABNORMAL HIGH (ref 0.05–0.27)

## 2017-07-29 LAB — LIPASE, BLOOD: Lipase: 62 U/L — ABNORMAL HIGH (ref 11–51)

## 2017-07-29 LAB — LACTIC ACID, PLASMA: Lactic Acid, Venous: 1 mmol/L (ref 0.5–1.9)

## 2017-07-29 MED ORDER — SODIUM CHLORIDE 0.9 % IV SOLN
INTRAVENOUS | Status: DC
  Administered 2017-07-29: 2 [IU]/h via INTRAVENOUS
  Filled 2017-07-29: qty 1

## 2017-07-29 MED ORDER — ENOXAPARIN SODIUM 40 MG/0.4ML ~~LOC~~ SOLN
40.0000 mg | SUBCUTANEOUS | Status: DC
  Administered 2017-07-30 – 2017-07-31 (×2): 40 mg via SUBCUTANEOUS
  Filled 2017-07-29 (×2): qty 0.4

## 2017-07-29 MED ORDER — OXYCODONE-ACETAMINOPHEN 5-325 MG PO TABS
1.0000 | ORAL_TABLET | Freq: Four times a day (QID) | ORAL | Status: DC | PRN
  Administered 2017-07-29 – 2017-07-30 (×2): 1 via ORAL
  Filled 2017-07-29 (×2): qty 1

## 2017-07-29 MED ORDER — POTASSIUM CHLORIDE 20 MEQ PO PACK
60.0000 meq | PACK | Freq: Once | ORAL | Status: AC
  Administered 2017-07-30: 60 meq via ORAL
  Filled 2017-07-29: qty 3

## 2017-07-29 MED ORDER — DEXTROSE-NACL 5-0.45 % IV SOLN: INTRAVENOUS | Status: DC

## 2017-07-29 MED ORDER — ONDANSETRON HCL 4 MG/2ML IJ SOLN
INTRAMUSCULAR | Status: AC
  Filled 2017-07-29: qty 2

## 2017-07-29 MED ORDER — INSULIN REGULAR HUMAN 100 UNIT/ML IJ SOLN: INTRAMUSCULAR | Status: DC

## 2017-07-29 MED ORDER — INSULIN GLARGINE 100 UNIT/ML ~~LOC~~ SOLN
15.0000 [IU] | Freq: Every day | SUBCUTANEOUS | Status: DC
  Administered 2017-07-30 (×2): 15 [IU] via SUBCUTANEOUS
  Filled 2017-07-29 (×3): qty 0.15

## 2017-07-29 MED ORDER — HYDROMORPHONE HCL 1 MG/ML IJ SOLN
INTRAMUSCULAR | Status: AC
  Filled 2017-07-29: qty 1

## 2017-07-29 MED ORDER — ONDANSETRON HCL 4 MG/2ML IJ SOLN
4.0000 mg | Freq: Once | INTRAMUSCULAR | Status: AC
  Administered 2017-07-29: 4 mg via INTRAVENOUS

## 2017-07-29 MED ORDER — SODIUM CHLORIDE 0.9 % IV SOLN: INTRAVENOUS | Status: DC

## 2017-07-29 MED ORDER — HYDROMORPHONE HCL 1 MG/ML IJ SOLN
1.0000 mg | Freq: Once | INTRAMUSCULAR | Status: AC
  Administered 2017-07-29: 1 mg via INTRAVENOUS

## 2017-07-29 MED ORDER — SODIUM CHLORIDE 0.9 % IV SOLN
INTRAVENOUS | Status: DC
  Administered 2017-07-29: via INTRAVENOUS

## 2017-07-29 MED ORDER — SODIUM CHLORIDE 0.9 % IV SOLN
INTRAVENOUS | Status: DC
  Administered 2017-07-30: 01:00:00 via INTRAVENOUS

## 2017-07-29 MED ORDER — DEXTROSE-NACL 5-0.45 % IV SOLN
INTRAVENOUS | Status: DC
Start: 2017-07-29 — End: 2017-07-29

## 2017-07-29 MED ORDER — MORPHINE SULFATE (PF) 2 MG/ML IV SOLN
2.0000 mg | INTRAVENOUS | Status: DC | PRN
  Administered 2017-07-29 – 2017-07-31 (×2): 2 mg via INTRAVENOUS

## 2017-07-29 MED ORDER — INSULIN ASPART 100 UNIT/ML ~~LOC~~ SOLN
0.0000 [IU] | SUBCUTANEOUS | Status: DC
  Administered 2017-07-30 (×2): 5 [IU] via SUBCUTANEOUS
  Filled 2017-07-29 (×2): qty 1

## 2017-07-29 MED ORDER — MORPHINE SULFATE (PF) 2 MG/ML IV SOLN
INTRAVENOUS | Status: AC
  Administered 2017-07-31: 2 mg via INTRAVENOUS
  Filled 2017-07-29: qty 1

## 2017-07-29 MED ORDER — POTASSIUM CHLORIDE 10 MEQ/100ML IV SOLN
10.0000 meq | INTRAVENOUS | Status: DC
  Filled 2017-07-29 (×2): qty 100

## 2017-07-29 MED ORDER — ONDANSETRON HCL 4 MG/2ML IJ SOLN: 4.0000 mg | Freq: Four times a day (QID) | INTRAMUSCULAR | Status: DC | PRN

## 2017-07-29 MED ORDER — SODIUM CHLORIDE 0.9 % IV BOLUS (SEPSIS)
1000.0000 mL | Freq: Once | INTRAVENOUS | Status: AC
  Administered 2017-07-29: 1000 mL via INTRAVENOUS

## 2017-07-29 NOTE — Progress Notes (Signed)
eLink Physician-Brief Progress Note Patient Name: Kevin Maynard DOB: 1976/03/24 MRN: [id number]   Date of Service  07/29/2017  HPI/Events of Note  41 yo male admitted with DKA and elevated lipase. On insulin IV infusion. VSS. PCCM asked to assume care in ICU.  eICU Interventions  No new orders.     Intervention Category Evaluation Type: New Patient Evaluation  Lenell Antu 07/29/2017, 11:39 PM

## 2017-07-29 NOTE — Consult Note (Signed)
PULMONARY / CRITICAL CARE MEDICINE   Name: Kevin Maynard MRN: [id number] DOB: 1976-06-12    ADMISSION DATE:  07/29/2017   CONSULTATION DATE:  07/29/2017  REFERRING MD:  Dr Imogene Burn  REASON: DKA  CHIEF COMPLAINT:  Abdominal pain  HISTORY OF PRESENT ILLNESS:   This is a 41 year old Caucasian male with a past medical history as indicated below who presented with complaints of abdominal pain, nausea, vomiting and diarrhea 4 days. Patient was seen by his primary care provider and found to have hematuria. He was then referred for a CT abdomen to rule out kidney stones His CT abdomen suggested acute pancreatitis. He was referred to the emergency room for further evaluation. Patient states that symptoms started gradually and got worse over the course of 40s. He denies any fever, chills, bloody stools and bloody emesis. At the ED,his lipase level was elevated at 18, and he was also hyperglycemic. He was started on an insulin drip and admitted to the ICU. Currently, reports much improvement in symptoms with intermittent right upper quadrant and left upper quadrant abdominal pain. 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  PAST SURGICAL HISTORY: He  has a past surgical history that includes Cholecystectomy (2001); Knee surgery (4098,11,91); and Sphincterotomy (N/A, 07/23/2016).  No Known Allergies  No current facility-administered medications on file prior to encounter.    Current Outpatient Prescriptions on File Prior to Encounter   Medication Sig  . benazepril-hydrochlorthiazide (LOTENSIN HCT) 20-25 MG tablet Take 1 tablet by mouth daily.  Marland Kitchen buPROPion (WELLBUTRIN XL) 150 MG 24 hr tablet Take 150 mg by mouth 2 (two) times daily.    FAMILY HISTORY:  His is adopted.    SOCIAL HISTORY: He  reports that he has been smoking Cigarettes.  He has been smoking about 0.25 packs per day. He quit smokeless tobacco use about 28 years ago. He reports that he drinks alcohol. He reports that he does not use drugs.  REVIEW OF SYSTEMS:   Constitutional: Negative for fever and chills.  HENT: Negative for congestion and rhinorrhea.  Eyes: Negative for redness and visual disturbance.  Respiratory: Negative for shortness of breath and wheezing.  Cardiovascular: Negative for chest pain and palpitations.  Gastrointestinal: positive  for nausea , vomiting and abdominal pain and  Loose stools Genitourinary: Negative for dysuria and urgency.  Endocrine: Denies polyuria, polyphagia and heat intolerance Musculoskeletal: Negative for myalgias and arthralgias.  Skin: Negative for pallor and wound.  Neurological: Negative for dizziness and headaches   SUBJECTIVE:   VITAL SIGNS: BP 126/79 (BP Location: Left Arm)   Pulse 74   Temp 98.8 F (37.1 C) (Oral)   Resp (!) 22   Ht  (1.778 m)   Wt 236 lb 15.9 oz (107.5 kg)   SpO2 94%   BMI 34.01 kg/m   HEMODYNAMICS:    VENTILATOR SETTINGS:    INTAKE / OUTPUT: No intake/output data recorded.  PHYSICAL EXAMINATION: General: well-nourished, well-developed, no acute distress Neuro: alert and oriented 4, cranial nerves intact HEENT: PERRLA, oral mucosa dry Cardiovascular:  Apical pulse regular, S1, S2, no murmur, regurg or gallop, +2 pulses bilaterally, no edema Lungs: clear  to auscultation bilaterally Abdomen:  Obese, normal bowel sounds in all 4 quadrants, palpation reveals no organomegaly, diffuse tenderness in right upper quadrant and left upper quadrant, more in the left  upper quadrant; No referred rebound tenderness Musculoskeletal:  Positive range of motion, no deformities Skin:  Warm and dry  LABS:  BMET  Recent Labs Lab 07/29/17 1754 07/29/17 2050  NA 129* 131*  K 3.6 3.3*  CL 88* 94*  CO2 23 23  BUN 13 12  CREATININE 1.11 0.84  GLUCOSE 294* 266*    Electrolytes  Recent Labs Lab 07/29/17 1754 07/29/17 2050  CALCIUM 9.3 8.6*    CBC  Recent Labs Lab 07/29/17 1754  WBC 14.2*  HGB 15.8  HCT 43.6  PLT 258    Coag's No results for input(s): APTT, INR in the last 168 hours.  Sepsis Markers  Recent Labs Lab 07/29/17 2017  LATICACIDVEN 1.0    ABG  Recent Labs Lab 07/29/17 2048  PHART 7.38  PCO2ART 40  PO2ART 81*    Liver Enzymes  Recent Labs Lab 07/29/17 1754  AST 25  ALT 35  ALKPHOS 62  BILITOT 1.3*  ALBUMIN 4.3    Cardiac Enzymes No results for input(s): TROPONINI, PROBNP in the last 168 hours.  Glucose  Recent Labs Lab 07/29/17 2022 07/29/17 2120 07/29/17 2229 07/29/17 2310  GLUCAP 275* 259* 243* 232*    Imaging Ct Renal Stone Study  Result Date: 07/29/2017 CLINICAL DATA:  Abdominal pain extending to back EXAM: CT ABDOMEN AND PELVIS WITHOUT CONTRAST TECHNIQUE: Multidetector CT imaging of the abdomen and pelvis was performed following the standard protocol without oral or intravenous contrast material administration. COMPARISON:  June 25, 2014 FINDINGS: Lower chest: Lung bases are clear. Hepatobiliary: Liver measures 22.2 cm in length. There is hepatic steatosis. Gallbladder is absent. There is no biliary duct dilatation. Pancreas: There is soft tissue stranding in the mesenteric adjacent to the tail of the pancreas. The pancreas is not enlarged, and there is no mass or abnormal fluid. No pancreatic duct dilatation. Remainder of pancreas appears normal. Spleen: Spleen measures 13.9 x 11.9 x 6.7 cm with a measured splenic volume of 554 cubic cm. No focal splenic lesions are evident.  Adrenals/Urinary Tract: Adrenals appear normal bilaterally. Kidneys bilaterally show no evident mass or hydronephrosis on either side. There is no appreciable renal or ureteral calculus on either side. Urinary bladder is midline with wall thickness within normal limits. Stomach/Bowel: There is no appreciable bowel wall or mesenteric thickening. There is no appreciable bowel obstruction. No evident free air or portal venous air. There is lipomatous infiltration of the ileocecal valve. Vascular/Lymphatic: There is no abdominal aortic aneurysm. No evident vascular lesions on this noncontrast enhanced study. There is no appreciable adenopathy in the abdomen or pelvis. Reproductive: Prostate and seminal vesicles are normal in size and contour. There is no pelvic mass. Other: Appendix appears normal. There is no abscess or ascites in the abdomen or pelvis. Musculoskeletal: There are no appreciable blastic or lytic bone lesions. There is no intramuscular or abdominal wall lesion. IMPRESSION: 1. Soft tissue stranding in the mesenteric adjacent to the tail of the pancreas. Suspect early acute pancreatitis in this area. Appropriate laboratory correlation advised. No pancreatic mass or fluid collection. No pancreatic duct dilatation. 2. Hepatic and splenic prominence. There is hepatic steatosis. Gallbladder is absent. No biliary duct dilatation evident. 3.  No bowel obstruction.  No abscess.  Appendix appears normal. 4.  No renal or ureteral calculus.  No  evident hydronephrosis. These results will be called to the ordering clinician or representative by the Radiologist Assistant, and communication documented in the PACS or zVision Dashboard. Electronically Signed   By: Bretta Bang III M.D.   On: 07/29/2017 16:28    STUDIES:  none  CULTURES: none  ANTIBIOTICS: none  SIGNIFICANT EVENTS: 07/29/2017: Admitted  LINES/TUBES: Peripheral IVs  DISCUSSION: 41 year old Caucasian male presenting with DKA,  hypokalemia and acute pancreatitis  ASSESSMENT Hyperosmolar hyperglycemic state Acute pancreatitis Hypokalemia Mild leukocytosis hyponatremia H/O T2DM H/O Hyperlipidemia H/O Hypertension  PLAN Hemodynamics per ICU protocol DKA protocol and transitioned to subcutaneous insulin. Monitor and replace electrolytes IV fluids Pain management Trend amylase, lipase and LFTs. Hemoglobin A1c and lipid panel in a.m. Trend CBC and monitor fever curve We'll hold off on antibiotics for now Hold off on home oral antidiabetic medications. GI and DVT prophylaxis   FAMILY  - Updates: patient updated at bedside on current treatment plan  - Inter-disciplinary family meet or Palliative Care meeting due by:  day 7    Magdalene S. [facility] ANP-BC Pulmonary and Critical Care Medicine [facility] Pager [phone] or [phone]  07/29/2017, 11:43 PM   STAFF NOTE: I. Dr. Nicholos Johns, have personally reviewed the patient's available data including medical history , events of notes, physican examination and test results as part of my evaluation. I have discussed with the  Care with the NP and other care providers including  pharmacist, ICU RN, RRT, dietary.  Physical Exam Lungs - Decreased air entry bilaterally.   Pt was admitted with DKA, and abd pain. Now on IV insulin will continue with DKA protocol. Images personally reviewed, suggestive of pancreatitis. Lipase moderately elevated. MDMA positive on Utox screen.  Continue IVF. Transition to sq insulin when amenable.   Wells Guiles, MD.   Board Certified in Internal Medicine, Pulmonary Medicine, Critical Care Medicine, and Sleep Medicine.  [place] [facility] Office Number: [phone] Pager: [phone]  Santiago Glad, M.D.  Billy Fischer, M.D

## 2017-07-29 NOTE — ED Notes (Signed)
Awaiting insulin drip from pharmacy. 

## 2017-07-29 NOTE — ED Notes (Signed)
RT at bedside to drawn ABG

## 2017-07-29 NOTE — ED Notes (Signed)
Pt told that he has acute pancreatitis from outpatient CT scan done today. Denies NVD. Pt saw PCP for blood in urine today.

## 2017-07-29 NOTE — ED Triage Notes (Signed)
Pt to ED after having CT scan done to evaluate for kidney stone. Per pt CT showed acute pancreatitis. Pt states that he is having generalized abdominal pain and nausea. No vomiting. Pt was seen today by PCP and told that he had blood in his urine as well.

## 2017-07-29 NOTE — H&P (Addendum)
[employer] - [place] at [facility]   PATIENT NAME: Kevin Maynard    MR#:  [id number]  DATE OF BIRTH:  [birth year]  DATE OF ADMISSION:  07/29/2017  PRIMARY CARE PHYSICIAN: Patrice Paradise, MD   REQUESTING/REFERRING PHYSICIAN: Rockne Menghini, MD  CHIEF COMPLAINT:   Chief Complaint  Patient presents with  . Pancreatitis   Abdominal pain, nausea and vomiting 4 days. HISTORY OF PRESENT ILLNESS:  Kevin Maynard  is a 41 y.o. male with a known history of Hypertension, Diabetes 2, hyperlipidemia, obstructive sleep apnea and a stomach ulcer. The patient presently ED with the above chief complaints. He has diffuse abdominal pain in the upper abdominal which has been worsening for the past 4 days, associated with nausea, vomiting and diarrhea. He also complains of bloody stool yesterday. His lipase is elevated and AG is 18.  PAST MEDICAL HISTORY:   Past Medical History:  Diagnosis Date  . Abdominal pain, right upper quadrant 08/06/2014  . Ankle fracture   . BP (high blood pressure) 08/06/2014  . Controlled type 2 diabetes mellitus without complication (HCC) 09/05/2014  . 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 GERD (gastroesophageal reflux disease)   . HLD (hyperlipidemia) 08/06/2014  . Obstructive apnea 01/13/2016  . Stomach ulcer   . Type 2 diabetes mellitus with hyperglycemia (HCC) 08/06/2014    PAST SURGICAL HISTORY:   Past Surgical History:  Procedure Laterality Date  . CHOLECYSTECTOMY  2001  . KNEE SURGERY  [phone]  . SPHINCTEROTOMY N/A 07/23/2016   Procedure: LATERAL INTERNAL ANAL SPHINCTEROTOMY;  Surgeon: Nadeen Landau, MD;  Location: [facility] ORS;  Service: General;  Laterality: N/A;    SOCIAL HISTORY:   Social History  Substance Use Topics  . Smoking status: Current Some Day Smoker    Packs/day: 0.25    Types: Cigarettes  . Smokeless tobacco: Former Neurosurgeon    Quit date: 07/15/1989  . Alcohol use 0.0 oz/week     Comment:  occasional    FAMILY HISTORY:   Family History  Problem Relation Age of Onset  . Adopted: Yes    DRUG ALLERGIES:  No Known Allergies  REVIEW OF SYSTEMS:   Review of Systems  Constitutional: Negative for chills, fever and malaise/fatigue.  HENT: Negative for sore throat.   Eyes: Negative for blurred vision and double vision.  Respiratory: Negative for cough, hemoptysis, shortness of breath, wheezing and stridor.   Cardiovascular: Negative for chest pain, palpitations, orthopnea and leg swelling.  Gastrointestinal: Positive for abdominal pain, blood in stool, diarrhea, nausea and vomiting. Negative for melena.  Genitourinary: Negative for dysuria, flank pain and hematuria.  Musculoskeletal: Negative for back pain and joint pain.  Neurological: Negative for dizziness, sensory change, focal weakness, seizures, loss of consciousness, weakness and headaches.  Endo/Heme/Allergies: Negative for polydipsia.  Psychiatric/Behavioral: Negative for depression. The patient is not nervous/anxious.     MEDICATIONS AT HOME:   Prior to Admission medications   Medication Sig Start Date End Date Taking? Authorizing Provider  benazepril-hydrochlorthiazide (LOTENSIN HCT) 20-25 MG tablet Take 1 tablet by mouth daily. 12/25/15  Yes [provider]  buPROPion (WELLBUTRIN XL) 150 MG 24 hr tablet Take 150 mg by mouth 2 (two) times daily.   Yes [provider]  escitalopram (LEXAPRO) 10 MG tablet Take 10 mg by mouth daily.   Yes [provider]  fenofibrate 160 MG tablet Take 160 mg by mouth daily.   Yes [provider]  liraglutide (VICTOZA) 18 MG/3ML SOPN Inject 1.8  mg into the skin daily.   Yes [provider]  metFORMIN (GLUCOPHAGE) 500 MG tablet Take 500-1,000 mg by mouth 2 (two) times daily with a meal. 500 mg every morning and 1000 mg at bedtime   Yes [provider]  pantoprazole (PROTONIX) 40 MG tablet Take 40 mg by mouth daily.   Yes [provider]  pravastatin (PRAVACHOL) 40 MG tablet Take 40 mg by mouth at bedtime.   Yes [provider]      VITAL SIGNS:  Blood pressure 133/90, pulse 82, temperature 98.9 F (37.2 C), temperature source Oral, resp. rate 20, height  (1.778 m), weight 236 lb (107 kg), SpO2 95 %.  PHYSICAL EXAMINATION:  Physical Exam  GENERAL:  41 y.o.-year-old patient lying in the bed with no acute distress. Morbid obesity. EYES: Pupils equal, round, reactive to light and accommodation. No scleral icterus. Extraocular muscles intact.  HEENT: Head atraumatic, normocephalic. Oropharynx and nasopharynx clear.  NECK:  Supple, no jugular venous distention. No thyroid enlargement, no tenderness.  LUNGS: Normal breath sounds bilaterally, no wheezing, rales,rhonchi or crepitation. No use of accessory muscles of respiration.  CARDIOVASCULAR: S1, S2 normal. No murmurs, rubs, or gallops.  ABDOMEN: Soft, diffuse tenderness in the upper abdomen, nondistended. Bowel sounds present. No organomegaly or mass.  EXTREMITIES: No pedal edema, cyanosis, or clubbing.  NEUROLOGIC: Cranial nerves II through XII are intact. Muscle strength 5/5 in all extremities. Sensation intact. Gait not checked.  PSYCHIATRIC: The patient is alert and oriented x 3.  SKIN: No obvious rash, lesion, or ulcer.   LABORATORY PANEL:   CBC  Recent Labs Lab 07/29/17 1754  WBC 14.2*  HGB 15.8  HCT 43.6  PLT 258   ------------------------------------------------------------------------------------------------------------------  Chemistries   Recent Labs Lab 07/29/17 1754  NA 129*  K 3.6  CL 88*  CO2 23  GLUCOSE 294*  BUN 13  CREATININE 1.11  CALCIUM 9.3  AST 25  ALT 35  ALKPHOS 62  BILITOT 1.3*   ------------------------------------------------------------------------------------------------------------------  Cardiac Enzymes No results for input(s): TROPONINI in the last 168  hours. ------------------------------------------------------------------------------------------------------------------  RADIOLOGY:  Ct Renal Stone Study  Result Date: 07/29/2017 CLINICAL DATA:  Abdominal pain extending to back EXAM: CT ABDOMEN AND PELVIS WITHOUT CONTRAST TECHNIQUE: Multidetector CT imaging of the abdomen and pelvis was performed following the standard protocol without oral or intravenous contrast material administration. COMPARISON:  June 25, 2014 FINDINGS: Lower chest: Lung bases are clear. Hepatobiliary: Liver measures 22.2 cm in length. There is hepatic steatosis. Gallbladder is absent. There is no biliary duct dilatation. Pancreas: There is soft tissue stranding in the mesenteric adjacent to the tail of the pancreas. The pancreas is not enlarged, and there is no mass or abnormal fluid. No pancreatic duct dilatation. Remainder of pancreas appears normal. Spleen: Spleen measures 13.9 x 11.9 x 6.7 cm with a measured splenic volume of 554 cubic cm. No focal splenic lesions are evident. Adrenals/Urinary Tract: Adrenals appear normal bilaterally. Kidneys bilaterally show no evident mass or hydronephrosis on either side. There is no appreciable renal or ureteral calculus on either side. Urinary bladder is midline with wall thickness within normal limits. Stomach/Bowel: There is no appreciable bowel wall or mesenteric thickening. There is no appreciable bowel obstruction. No evident free air or portal venous air. There is lipomatous infiltration of the ileocecal valve. Vascular/Lymphatic: There is no abdominal aortic aneurysm. No evident vascular lesions on this noncontrast enhanced study. There is no appreciable adenopathy in the abdomen or pelvis. Reproductive: Prostate  and seminal vesicles are normal in size and contour. There is no pelvic mass. Other: Appendix appears normal. There is no abscess or ascites in the abdomen or pelvis. Musculoskeletal: There are no appreciable blastic or  lytic bone lesions. There is no intramuscular or abdominal wall lesion. IMPRESSION: 1. Soft tissue stranding in the mesenteric adjacent to the tail of the pancreas. Suspect early acute pancreatitis in this area. Appropriate laboratory correlation advised. No pancreatic mass or fluid collection. No pancreatic duct dilatation. 2. Hepatic and splenic prominence. There is hepatic steatosis. Gallbladder is absent. No biliary duct dilatation evident. 3.  No bowel obstruction.  No abscess.  Appendix appears normal. 4.  No renal or ureteral calculus.  No evident hydronephrosis. These results will be called to the ordering clinician or representative by the Radiologist Assistant, and communication documented in the PACS or zVision Dashboard. Electronically Signed   By: Bretta Bang III M.D.   On: 07/29/2017 16:28      IMPRESSION AND PLAN:   DKA with diabetes 2 The patient will be admitted to stepdown unit. Start insulin drip, follow-up BMP every 4 hours. IV fluid support. Per ICU nurse practitioner, intensivist to suggest beta hydroxybutyric acid level. If it fine, the patient can be transfer to floor with IVF.   Acute pancreatitis. Nothing by mouth with IV fluid support, pain control, follow-up lipase in a.m.  Hyponatremia. Normal saline IV and follow-up BMP.  Leukocytosis. Possible due to reaction. Follow-up CBC.  Hypertension. Hold hypertension medication at this time. Diabetes. Hold metformin. Hyperlipidemia. Morbid obesity.  All the records are reviewed and case discussed with ED provider. Management plans discussed with the patient, his father and they are in agreement.  CODE STATUS: Full code.  TOTAL CRITICAL TIME TAKING CARE OF THIS PATIENT: 56 minutes.    Shaune Pollack M.D on 07/29/2017 at 8:48 PM  Between 7am to 6pm - Pager - [phone]  After 6pm go to [email or web address] - Social research officer, government  [employer]  Hospitalists  Office  [phone]  CC: Primary care  physician; Patrice Paradise, MD   Note: This dictation was prepared with Dragon dictation along with smaller phrase technology. Any transcriptional errors that result from this process are unin

## 2017-07-29 NOTE — ED Notes (Signed)
Per ICU staff, admission labs need to be drawn and resulted to determine which kind of admission bed needs to go to.

## 2017-07-29 NOTE — ED Provider Notes (Signed)
[facility] Emergency Department Provider Note  ____________________________________________  Time seen: Approximately 7:52 PM  I have reviewed the triage vital signs and the nursing notes.   HISTORY  Chief Complaint Pancreatitis    HPI Kevin Maynard is a 41 y.o. male with a hx of familal hyperlipoproteinemia and DM2, non ETOH user, presenting for pancreatitis.  The pt reports that for the past 4d, he has had a progressively worsening upper abd pain associated with nausea w/o vomiting.  + Loose stool, no fevers or chills.  He is able to keep down food and liquids.  + hematuria. He was seen by his PMD who ordered a CT to eval for renal stone, which is pos for acute pancreatitis.   Past Medical History:  Diagnosis Date  . Abdominal pain, right upper quadrant 08/06/2014  . Ankle fracture   . BP (high blood pressure) 08/06/2014  . Controlled type 2 diabetes mellitus without complication (HCC) 09/05/2014  . 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 GERD (gastroesophageal reflux disease)   . HLD (hyperlipidemia) 08/06/2014  . Obstructive apnea 01/13/2016  . Stomach ulcer   . Type 2 diabetes mellitus with hyperglycemia (HCC) 08/06/2014    Patient Active Problem List   Diagnosis Date Noted  . Obstructive apnea 01/13/2016  . Controlled type 2 diabetes mellitus without complication (HCC) 09/05/2014  . Abdominal pain, right upper quadrant 08/06/2014  . Type 2 diabetes mellitus with hyperglycemia (HCC) 08/06/2014  . HLD (hyperlipidemia) 08/06/2014  . BP (high blood pressure) 08/06/2014  . 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 Current tobacco use 08/06/2014    Past Surgical History:  Procedure Laterality Date  . CHOLECYSTECTOMY  2001  . KNEE SURGERY  [phone]  . SPHINCTEROTOMY N/A 07/23/2016   Procedure: LATERAL INTERNAL ANAL SPHINCTEROTOMY;  Surgeon: Nadeen Landau, MD;  Location: [facility] ORS;  Service: General;  Laterality: N/A;     Current Outpatient Rx  . Order #: [id number] Class: Historical Med  . Order #: [id number] Class: Historical Med  . Order #: [id number] Class: Historical Med  . Order #: [id number] Class: Print  . Order #: [id number] Class: Historical Med  . Order #: [id number] Class: Historical Med  . Order #: [id number] Class: Historical Med  . Order #: [id number] Class: Historical Med  . Order #: [id number] Class: Normal    Allergies Patient has no known allergies.  Family History  Problem Relation Age of Onset  . Adopted: Yes    Social History Social History  Substance Use Topics  . Smoking status: Current Some Day Smoker    Packs/day: 0.25    Types: Cigarettes  . Smokeless tobacco: Former Neurosurgeon    Quit date: 07/15/1989  . Alcohol use 0.0 oz/week     Comment: occasional    Review of Systems Constitutional: No fever/chills.no lightheadedness orcope. Eyes: No visual changes. ENT: No sore throat. No congestion or rhinorrhea. Cardiovascular: Denies chest pain. Denies palpitations. Respiratory: Denies shortness of breath.  No cough. Gastrointestinal: positive upperabdominal pain.  positivenausea, no vomiting.  positivediarrhea.  No constipation. Genitourinary: Negative for dysuria.positive hematuria. Musculoskeletal: Negative for back pain. Skin: Negative for rash. Neurological: Negative for headaches. No focal numbness, tingling or weakness.     ____________________________________________   PHYSICAL EXAM:  VITAL SIGNS: ED Triage Vitals  Enc Vitals Group     BP 07/29/17 1750 130/89     Pulse Rate 07/29/17 1750 93     Resp 07/29/17 1750 16     Temp 07/29/17 1750 98.9 F (37.2 C)  Temp Source 07/29/17 1750 Oral     SpO2 07/29/17 1750 100 %     Weight 07/29/17 1748 236 lb (107 kg)     Height 07/29/17 1748  (1.778 m)     Head Circumference --      Peak Flow --      Pain Score 07/29/17 1748 9     Pain Loc --      Pain Edu? --      Excl. in GC? --     Constitutional: Alert  and oriented. Mildly uncomfortable appearing butin no acute distress. Answers questions appropriately. Eyes: Conjunctivae are normal.  EOMI. No scleral icterus. Head: Atraumatic. Nose: No congestion/rhinnorhea. Mouth/Throat: Mucous membranes are moist.  Neck: No stridor.  Supple.   Cardiovascular: Normal rate, regular rhythm. No murmurs, rubs or gallops.  Respiratory: Normal respiratory effort.  No accessory muscle use or retractions. Lungs CTAB.  No wheezes, rales or ronchi. Gastrointestinal: Soft, and nondistended.  Tender to palpation throughout the entire abd but worse in the epigastrium and RUQ. No guarding or rebound.  No peritoneal signs. Musculoskeletal: No LE edema. No ttp in the calves or palpable cords.  Negative Homan's sign. Neurologic:  A&Ox3.  Speech is clear.  Face and smile are symmetric.  EOMI.  Moves all extremities well. Skin:  Skin is warm, dry and intact. No rash noted. Psychiatric: Mood and affect are normal. Speech and behavior are normal.  Normal judgement.  ____________________________________________   LABS (all labs ordered are listed, but only abnormal results are displayed)  Labs Reviewed  LIPASE, BLOOD - Abnormal; Notable for the following:       Result Value   Lipase 62 (*)    All other components within normal limits  COMPREHENSIVE METABOLIC PANEL - Abnormal; Notable for the following:    Sodium 129 (*)    Chloride 88 (*)    Glucose, Bld 294 (*)    Total Bilirubin 1.3 (*)    Anion gap 18 (*)    All other components within normal limits  CBC - Abnormal; Notable for the following:    WBC 14.2 (*)    MCHC 36.2 (*)    All other components within normal limits  URINALYSIS, COMPLETE (UACMP) WITH MICROSCOPIC - Abnormal; Notable for the following:    Color, Urine YELLOW (*)    APPearance CLEAR (*)    Specific Gravity, Urine 1.033 (*)    Glucose, UA >=500 (*)    Hgb urine dipstick MODERATE (*)    Ketones, ur 20 (*)    Protein, ur 100 (*)    Bacteria,  UA RARE (*)    All other components within normal limits  LACTIC ACID, PLASMA  LACTIC ACID, PLASMA   ____________________________________________  EKG  Not indicated ____________________________________________  RADIOLOGY  Ct Renal Stone Study  Result Date: 07/29/2017 CLINICAL DATA:  Abdominal pain extending to back EXAM: CT ABDOMEN AND PELVIS WITHOUT CONTRAST TECHNIQUE: Multidetector CT imaging of the abdomen and pelvis was performed following the standard protocol without oral or intravenous contrast material administration. COMPARISON:  June 25, 2014 FINDINGS: Lower chest: Lung bases are clear. Hepatobiliary: Liver measures 22.2 cm in length. There is hepatic steatosis. Gallbladder is absent. There is no biliary duct dilatation. Pancreas: There is soft tissue stranding in the mesenteric adjacent to the tail of the pancreas. The pancreas is not enlarged, and there is no mass or abnormal fluid. No pancreatic duct dilatation. Remainder of pancreas appears normal. Spleen: Spleen measures 13.9 x 11.9  x 6.7 cm with a measured splenic volume of 554 cubic cm. No focal splenic lesions are evident. Adrenals/Urinary Tract: Adrenals appear normal bilaterally. Kidneys bilaterally show no evident mass or hydronephrosis on either side. There is no appreciable renal or ureteral calculus on either side. Urinary bladder is midline with wall thickness within normal limits. Stomach/Bowel: There is no appreciable bowel wall or mesenteric thickening. There is no appreciable bowel obstruction. No evident free air or portal venous air. There is lipomatous infiltration of the ileocecal valve. Vascular/Lymphatic: There is no abdominal aortic aneurysm. No evident vascular lesions on this noncontrast enhanced study. There is no appreciable adenopathy in the abdomen or pelvis. Reproductive: Prostate and seminal vesicles are normal in size and contour. There is no pelvic mass. Other: Appendix appears normal. There is no  abscess or ascites in the abdomen or pelvis. Musculoskeletal: There are no appreciable blastic or lytic bone lesions. There is no intramuscular or abdominal wall lesion. IMPRESSION: 1. Soft tissue stranding in the mesenteric adjacent to the tail of the pancreas. Suspect early acute pancreatitis in this area. Appropriate laboratory correlation advised. No pancreatic mass or fluid collection. No pancreatic duct dilatation. 2. Hepatic and splenic prominence. There is hepatic steatosis. Gallbladder is absent. No biliary duct dilatation evident. 3.  No bowel obstruction.  No abscess.  Appendix appears normal. 4.  No renal or ureteral calculus.  No evident hydronephrosis. These results will be called to the ordering clinician or representative by the Radiologist Assistant, and communication documented in the PACS or zVision Dashboard. Electronically Signed   By: Bretta Bang III M.D.   On: 07/29/2017 16:28    ____________________________________________   PROCEDURES  Procedure(s) performed: None  Procedures  Critical Care performed: No ____________________________________________   INITIAL IMPRESSION / ASSESSMENT AND PLAN / ED COURSE  Pertinent labs & imaging results that were available during my care of the patient were reviewed by me and considered in my medical decision making (see chart for details).  41 y.o. male with acute pancreatitiis, associated with nausea, abdominal pain, diarrhea. Overall, the patient is hemodynamically stable, but he does have hyponatremia, and elevated white blood cell count, hyperglycemia w/ DKA, and acute pancreatitis.  The pt will require admission to the hospital for further evaluation and treatment.  ____________________________________________  FINAL CLINICAL IMPRESSION(S) / ED DIAGNOSES  Final diagnoses:  Other acute pancreatitis, unspecified complication status  Diabetic ketoacidosis without coma associated with type 2 diabetes mellitus (HCC)          NEW MEDICATIONS STARTED DURING THIS VISIT:  New Prescriptions   No medications on file      Rockne Menghini, MD 07/29/17 [phone]

## 2017-07-30 DIAGNOSIS — E1010 Type 1 diabetes mellitus with ketoacidosis without coma: Secondary | ICD-10-CM

## 2017-07-30 DIAGNOSIS — K859 Acute pancreatitis without necrosis or infection, unspecified: Secondary | ICD-10-CM

## 2017-07-30 DIAGNOSIS — E871 Hypo-osmolality and hyponatremia: Secondary | ICD-10-CM

## 2017-07-30 DIAGNOSIS — E876 Hypokalemia: Secondary | ICD-10-CM

## 2017-07-30 LAB — COMPREHENSIVE METABOLIC PANEL
ALT: 25 U/L (ref 17–63)
AST: 15 U/L (ref 15–41)
Albumin: 3.2 g/dL — ABNORMAL LOW (ref 3.5–5.0)
Alkaline Phosphatase: 43 U/L (ref 38–126)
Anion gap: 7 (ref 5–15)
BUN: 10 mg/dL (ref 6–20)
CHLORIDE: 99 mmol/L — AB (ref 101–111)
CO2: 27 mmol/L (ref 22–32)
CREATININE: 0.7 mg/dL (ref 0.61–1.24)
Calcium: 8.2 mg/dL — ABNORMAL LOW (ref 8.9–10.3)
GFR calc Af Amer: >60 mL/min (ref >60)
GFR calc non Af Amer: >60 mL/min (ref >60)
GLUCOSE: 239 mg/dL — AB (ref 65–99)
Potassium: 3.3 mmol/L — ABNORMAL LOW (ref 3.5–5.1)
SODIUM: 133 mmol/L — AB (ref 135–145)
Total Bilirubin: 0.8 mg/dL (ref 0.3–1.2)
Total Protein: 5.8 g/dL — ABNORMAL LOW (ref 6.5–8.1)

## 2017-07-30 LAB — GLUCOSE, CAPILLARY
GLUCOSE-CAPILLARY: 192 mg/dL — AB (ref 65–99)
GLUCOSE-CAPILLARY: 269 mg/dL — AB (ref 65–99)
GLUCOSE-CAPILLARY: 252 mg/dL — AB (ref 65–99)

## 2017-07-30 LAB — CBC
HCT: 36.6 % — ABNORMAL LOW (ref 40.0–52.0)
Hemoglobin: 13.2 g/dL (ref 13.0–18.0)
MCH: 31.7 pg (ref 26.0–34.0)
MCHC: 36 g/dL (ref 32.0–36.0)
MCV: 88.1 fL (ref 80.0–100.0)
PLATELETS: 191 10*3/uL (ref 150–440)
RBC: 4.16 MIL/uL — ABNORMAL LOW (ref 4.40–5.90)
RDW: 12.6 % (ref 11.5–14.5)
WBC: 9.6 10*3/uL (ref 3.8–10.6)

## 2017-07-30 LAB — URINALYSIS, COMPLETE (UACMP) WITH MICROSCOPIC
BACTERIA UA: NONE SEEN
BILIRUBIN URINE: NEGATIVE
HGB URINE DIPSTICK: NEGATIVE
Ketones, ur: 20 mg/dL — AB
Leukocytes, UA: NEGATIVE
NITRITE: NEGATIVE
Protein, ur: 30 mg/dL — AB
SPECIFIC GRAVITY, URINE: 1.03 (ref 1.005–1.030)
pH: 6 (ref 5.0–8.0)

## 2017-07-30 LAB — PHOSPHORUS: Phosphorus: 3 mg/dL (ref 2.5–4.6)

## 2017-07-30 LAB — URINE DRUG SCREEN, QUALITATIVE (ARMC ONLY)
Amphetamines, Ur Screen: NOT DETECTED
BARBITURATES, UR SCREEN: NOT DETECTED
Benzodiazepine, Ur Scrn: NOT DETECTED
CANNABINOID 50 NG, UR ~~LOC~~: NOT DETECTED
COCAINE METABOLITE, UR ~~LOC~~: NOT DETECTED
MDMA (ECSTASY) UR SCREEN: POSITIVE — AB
Methadone Scn, Ur: NOT DETECTED
Opiate, Ur Screen: POSITIVE — AB
PHENCYCLIDINE (PCP) UR S: NOT DETECTED
TRICYCLIC, UR SCREEN: NOT DETECTED

## 2017-07-30 LAB — MRSA PCR SCREENING: MRSA by PCR: NEGATIVE

## 2017-07-30 LAB — AMYLASE: AMYLASE: 36 U/L (ref 28–100)

## 2017-07-30 LAB — TRIGLYCERIDES: Triglycerides: 1576 mg/dL — ABNORMAL HIGH (ref <150)

## 2017-07-30 LAB — LIPID PANEL
Cholesterol: 328 mg/dL — ABNORMAL HIGH (ref 0–200)
LDL CALC: UNDETERMINED mg/dL (ref 0–99)
Triglycerides: 1367 mg/dL — ABNORMAL HIGH (ref <150)
VLDL: UNDETERMINED mg/dL (ref 0–40)

## 2017-07-30 LAB — HEMOGLOBIN A1C
Hgb A1c MFr Bld: 11.8 % — ABNORMAL HIGH (ref 4.8–5.6)
MEAN PLASMA GLUCOSE: 291.96 mg/dL

## 2017-07-30 LAB — MAGNESIUM: Magnesium: 1.6 mg/dL — ABNORMAL LOW (ref 1.7–2.4)

## 2017-07-30 LAB — LIPASE, BLOOD: Lipase: 31 U/L (ref 11–51)

## 2017-07-30 MED ORDER — BENAZEPRIL HCL 20 MG PO TABS
20.0000 mg | ORAL_TABLET | Freq: Every day | ORAL | Status: DC
  Administered 2017-07-30 – 2017-08-01 (×3): 20 mg via ORAL
  Filled 2017-07-30 (×3): qty 1

## 2017-07-30 MED ORDER — BUPROPION HCL ER (XL) 150 MG PO TB24
150.0000 mg | ORAL_TABLET | Freq: Two times a day (BID) | ORAL | Status: DC
  Administered 2017-07-30 – 2017-08-01 (×5): 150 mg via ORAL
  Filled 2017-07-30 (×6): qty 1

## 2017-07-30 MED ORDER — PANTOPRAZOLE SODIUM 40 MG PO TBEC
40.0000 mg | DELAYED_RELEASE_TABLET | Freq: Every day | ORAL | Status: DC
  Administered 2017-07-30 – 2017-08-01 (×3): 40 mg via ORAL
  Filled 2017-07-30 (×3): qty 1

## 2017-07-30 MED ORDER — HYDROCHLOROTHIAZIDE 25 MG PO TABS
25.0000 mg | ORAL_TABLET | Freq: Every day | ORAL | Status: DC
  Administered 2017-07-30 – 2017-08-01 (×3): 25 mg via ORAL
  Filled 2017-07-30 (×3): qty 1

## 2017-07-30 MED ORDER — INSULIN ASPART 100 UNIT/ML ~~LOC~~ SOLN
0.0000 [IU] | Freq: Every day | SUBCUTANEOUS | Status: DC
  Administered 2017-07-30: 3 [IU] via SUBCUTANEOUS
  Filled 2017-07-30: qty 1

## 2017-07-30 MED ORDER — FENOFIBRATE 160 MG PO TABS
160.0000 mg | ORAL_TABLET | Freq: Every day | ORAL | Status: DC
  Administered 2017-07-30 – 2017-08-01 (×3): 160 mg via ORAL
  Filled 2017-07-30 (×3): qty 1

## 2017-07-30 MED ORDER — ATORVASTATIN CALCIUM 20 MG PO TABS
80.0000 mg | ORAL_TABLET | Freq: Every day | ORAL | Status: DC
  Administered 2017-07-30 – 2017-07-31 (×2): 80 mg via ORAL
  Filled 2017-07-30 (×2): qty 4

## 2017-07-30 MED ORDER — ACETAMINOPHEN 325 MG PO TABS
650.0000 mg | ORAL_TABLET | Freq: Four times a day (QID) | ORAL | Status: DC | PRN
  Administered 2017-07-30 – 2017-07-31 (×3): 650 mg via ORAL
  Filled 2017-07-30 (×3): qty 2

## 2017-07-30 MED ORDER — BENAZEPRIL-HYDROCHLOROTHIAZIDE 20-25 MG PO TABS: 1.0000 | ORAL_TABLET | Freq: Every day | ORAL | Status: DC

## 2017-07-30 MED ORDER — INSULIN GLARGINE 100 UNIT/ML ~~LOC~~ SOLN
10.0000 [IU] | Freq: Once | SUBCUTANEOUS | Status: AC
  Administered 2017-07-30: 10 [IU] via SUBCUTANEOUS
  Filled 2017-07-30 (×2): qty 0.1

## 2017-07-30 MED ORDER — INSULIN ASPART 100 UNIT/ML ~~LOC~~ SOLN
0.0000 [IU] | SUBCUTANEOUS | Status: DC
  Administered 2017-07-30: 4 [IU] via SUBCUTANEOUS
  Administered 2017-07-30 (×2): 11 [IU] via SUBCUTANEOUS
  Filled 2017-07-30 (×3): qty 1

## 2017-07-30 MED ORDER — LIVING WELL WITH DIABETES BOOK
Freq: Once | Status: AC
  Administered 2017-07-30: 17:00:00
  Filled 2017-07-30: qty 1

## 2017-07-30 MED ORDER — INSULIN ASPART 100 UNIT/ML ~~LOC~~ SOLN
0.0000 [IU] | Freq: Three times a day (TID) | SUBCUTANEOUS | Status: DC
  Administered 2017-07-31: 7 [IU] via SUBCUTANEOUS
  Administered 2017-07-31: 5 [IU] via SUBCUTANEOUS
  Administered 2017-07-31: 3 [IU] via SUBCUTANEOUS
  Filled 2017-07-30 (×3): qty 1

## 2017-07-30 MED ORDER — POTASSIUM CHLORIDE 20 MEQ PO PACK
60.0000 meq | PACK | Freq: Once | ORAL | Status: AC
  Administered 2017-07-30: 60 meq via ORAL
  Filled 2017-07-30: qty 3

## 2017-07-30 MED ORDER — POTASSIUM CHLORIDE IN NACL 40-0.9 MEQ/L-% IV SOLN
INTRAVENOUS | Status: DC
  Administered 2017-07-30 – 2017-07-31 (×4): 125 mL/h via INTRAVENOUS
  Filled 2017-07-30 (×6): qty 1000

## 2017-07-30 MED ORDER — ESCITALOPRAM OXALATE 10 MG PO TABS
10.0000 mg | ORAL_TABLET | Freq: Every day | ORAL | Status: DC
  Administered 2017-07-30 – 2017-08-01 (×3): 10 mg via ORAL
  Filled 2017-07-30 (×3): qty 1

## 2017-07-30 NOTE — Progress Notes (Signed)
[facility] [place] [facility]   PATIENT NAME: Kevin Maynard    MR#:  [id number]  DATE OF BIRTH:  02-Oct-1976  SUBJECTIVE:  CHIEF COMPLAINT:   Chief Complaint  Patient presents with  . Pancreatitis   - Sugars in the 200, came with increased anion gap. Off insulin drip at this time. -Still has abdominal pain and bloating. -Lipids are significantly elevated  REVIEW OF SYSTEMS:  Review of Systems  Constitutional: Negative for chills, fever and malaise/fatigue.  HENT: Negative for congestion, ear discharge, hearing loss and nosebleeds.   Eyes: Negative for blurred vision and double vision.  Respiratory: Negative for cough, shortness of breath and wheezing.   Cardiovascular: Negative for chest pain, palpitations and leg swelling.  Gastrointestinal: Positive for abdominal pain, nausea and vomiting. Negative for constipation and diarrhea.  Genitourinary: Negative for dysuria.  Musculoskeletal: Negative for myalgias.  Neurological: Negative for dizziness, sensory change, speech change, focal weakness, seizures and headaches.  Psychiatric/Behavioral: Negative for depression.    DRUG ALLERGIES:  No Known Allergies  VITALS:  Blood pressure 108/73, pulse 67, temperature 98.1 F (36.7 C), temperature source Oral, resp. rate 16, height  (1.778 m), weight 107.5 kg (236 lb 15.9 oz), SpO2 92 %.  PHYSICAL EXAMINATION:  Physical Exam  GENERAL:  41 y.o.-year-old patient lying in the bed with no acute distress.  EYES: Pupils equal, round, reactive to light and accommodation. No scleral icterus. Extraocular muscles intact.  HEENT: Head atraumatic, normocephalic. Oropharynx and nasopharynx clear.  NECK:  Supple, no jugular venous distention. No thyroid enlargement, no tenderness.  LUNGS: Normal breath sounds bilaterally, no wheezing, rales,rhonchi or crepitation. No use of accessory muscles of respiration.  CARDIOVASCULAR: S1, S2 normal. No murmurs, rubs, or  gallops.  ABDOMEN: Soft,tenderness in LUQ and mid abdominal region,, nondistended. Bowel sounds present. No organomegaly or mass.  EXTREMITIES: No pedal edema, cyanosis, or clubbing.  NEUROLOGIC: Cranial nerves II through XII are intact. Muscle strength 5/5 in all extremities. Sensation intact. Gait not checked.  PSYCHIATRIC: The patient is alert and oriented x 3.  SKIN: No obvious rash, lesion, or ulcer.    LABORATORY PANEL:   CBC  Recent Labs Lab 07/30/17 0440  WBC 9.6  HGB 13.2  HCT 36.6*  PLT 191   ------------------------------------------------------------------------------------------------------------------  Chemistries   Recent Labs Lab 07/29/17 2050 07/30/17 0440  NA 131* 133*  K 3.3* 3.3*  CL 94* 99*  CO2 23 27  GLUCOSE 266* 239*  BUN 12 10  CREATININE 0.84 0.70  CALCIUM 8.6* 8.2*  MG 1.6*  --   AST  --  15  ALT  --  25  ALKPHOS  --  43  BILITOT  --  0.8   ------------------------------------------------------------------------------------------------------------------  Cardiac Enzymes No results for input(s): TROPONINI in the last 168 hours. ------------------------------------------------------------------------------------------------------------------  RADIOLOGY:  Ct Renal Stone Study  Result Date: 07/29/2017 CLINICAL DATA:  Abdominal pain extending to back EXAM: CT ABDOMEN AND PELVIS WITHOUT CONTRAST TECHNIQUE: Multidetector CT imaging of the abdomen and pelvis was performed following the standard protocol without oral or intravenous contrast material administration. COMPARISON:  June 25, 2014 FINDINGS: Lower chest: Lung bases are clear. Hepatobiliary: Liver measures 22.2 cm in length. There is hepatic steatosis. Gallbladder is absent. There is no biliary duct dilatation. Pancreas: There is soft tissue stranding in the mesenteric adjacent to the tail of the pancreas. The pancreas is not enlarged, and there is no mass or abnormal fluid. No  pancreatic duct dilatation. Remainder of  pancreas appears normal. Spleen: Spleen measures 13.9 x 11.9 x 6.7 cm with a measured splenic volume of 554 cubic cm. No focal splenic lesions are evident. Adrenals/Urinary Tract: Adrenals appear normal bilaterally. Kidneys bilaterally show no evident mass or hydronephrosis on either side. There is no appreciable renal or ureteral calculus on either side. Urinary bladder is midline with wall thickness within normal limits. Stomach/Bowel: There is no appreciable bowel wall or mesenteric thickening. There is no appreciable bowel obstruction. No evident free air or portal venous air. There is lipomatous infiltration of the ileocecal valve. Vascular/Lymphatic: There is no abdominal aortic aneurysm. No evident vascular lesions on this noncontrast enhanced study. There is no appreciable adenopathy in the abdomen or pelvis. Reproductive: Prostate and seminal vesicles are normal in size and contour. There is no pelvic mass. Other: Appendix appears normal. There is no abscess or ascites in the abdomen or pelvis. Musculoskeletal: There are no appreciable blastic or lytic bone lesions. There is no intramuscular or abdominal wall lesion. IMPRESSION: 1. Soft tissue stranding in the mesenteric adjacent to the tail of the pancreas. Suspect early acute pancreatitis in this area. Appropriate laboratory correlation advised. No pancreatic mass or fluid collection. No pancreatic duct dilatation. 2. Hepatic and splenic prominence. There is hepatic steatosis. Gallbladder is absent. No biliary duct dilatation evident. 3.  No bowel obstruction.  No abscess.  Appendix appears normal. 4.  No renal or ureteral calculus.  No evident hydronephrosis. These results will be called to the ordering clinician or representative by the Radiologist Assistant, and communication documented in the PACS or zVision Dashboard. Electronically Signed   By: Bretta Bang III M.D.   On: 07/29/2017 16:28    EKG:    Orders placed or performed during the hospital encounter of 07/15/16  . EKG 12-Lead  . EKG 12-Lead    ASSESSMENT AND PLAN:   41 year old type II diabetic with hypertension, sleep apnea and peptic ulcer disease presents to the hospital secondary to nausea, vomiting and abdominal pain.  #1 Unontrolled diabetes mellitus with elevated anion gap-IV fluids, started on insulin drip. -anion gap closed. Patient is started on SQ insulin and sliding scale insulin. -On metformin and Victoza at home. Some noncompliance has been identified. -A1c is pending  #2 acute pancreatitis-significantly elevated triglycerides which are the cause for his pancreatitis. -Received IV insulin yesterday, currently off insulin. Started on statin and TriCor. -Ideally we keep the insulin drip until the triglycerides are less than thousand -Monitor closely.   #3 hematuria-none currently, urine RBCs are less than 5. No further testing needed.  #4 hypertriglyceridemia-likely hereditary, patient is adopted so unaware of any family history. -Started on statin and TriCor.  #5 hypokalemia-being replaced  #6 DVT prophylaxis-on Lovenox  Patient will be transferred to the floor today    All the records are reviewed and case discussed with Care Management/Social Workerr. Management plans discussed with the patient, family and they are in agreement.  CODE STATUS: Full Code  TOTAL TIME TAKING CARE OF THIS PATIENT: 38 minutes.   POSSIBLE D/C IN 2-3 DAYS, DEPENDING ON CLINICAL CONDITION.   Enid Baas M.D on 07/30/2017 at 9:38 AM  Between 7am to 6pm - Pager - [phone]  After 6pm go to [email or web address] - Social research officer, government  [facility]  Office  [phone]  CC: Primary care physician; Patrice Paradise, MD

## 2017-07-30 NOTE — Progress Notes (Signed)
Pt has tolerated po diet with no increased pain, remains tender to touch in LEFT upper quadrant. Up ambulatory around room. Discussed probably insulin treatment on discharge, pt gave himself shots this am and has a meter and knows hoe to use it. Given written information on insulin storage, and hypo signs and treatment. Transferred to floor in stable condition.

## 2017-07-30 NOTE — Progress Notes (Signed)
PULMONARY / CRITICAL CARE MEDICINE   Name: Kevin Maynard MRN: [id number] DOB: 1976-11-07    ADMISSION DATE:  07/29/2017   CONSULTATION DATE:  07/29/2017  REFERRING MD:  Dr Imogene Burn  REASON: DKA  CHIEF COMPLAINT:  Abdominal pain  HISTORY OF PRESENT ILLNESS:   This is a 41 year old Caucasian male with a past medical history as indicated below who presented with complaints of abdominal pain, nausea, vomiting and diarrhea 4 days. Patient was seen by his primary care provider and found to have hematuria. He was then referred for a CT abdomen to rule out kidney stones His CT abdomen suggested acute pancreatitis. He was referred to the emergency room for further evaluation. Patient states that symptoms started gradually and got worse over the course of 40s. He denies any fever, chills, bloody stools and bloody emesis. At the ED,his lipase level was elevated at 18, and he was also hyperglycemic. He was started on an insulin drip and admitted to the ICU. Currently, reports much improvement in symptoms with intermittent right upper quadrant and left upper quadrant abdominal pain. Nausea, vomiting and diarrhea resolved  SUBJECTIVE: Moderate improvement in symptoms. Still hyperglycemic but improved. Persistent hypokalemia  VITAL SIGNS: BP 124/73   Pulse 92   Temp 98.1 F (36.7 C) (Oral)   Resp 19   Ht  (1.778 m)   Wt 236 lb 15.9 oz (107.5 kg)   SpO2 92%   BMI 34.01 kg/m   HEMODYNAMICS:    VENTILATOR SETTINGS:    INTAKE / OUTPUT: No intake/output data recorded.  PHYSICAL EXAMINATION: General: well-nourished, well-developed, no acute distress Neuro: alert and oriented 4, cranial nerves intact HEENT: PERRLA, oral mucosa dry Cardiovascular:  Apical pulse regular, S1, S2, no murmur, regurg or gallop, +2 pulses bilaterally, no edema Lungs: clear to auscultation bilaterally Abdomen:  Obese, normal bowel sounds in all 4 quadrants, palpation reveals no organomegaly, diffuse  tenderness in right upper quadrant and left upper quadrant, more in the left upper quadrant; No referred rebound tenderness Musculoskeletal:  Positive range of motion, no deformities Skin:  Warm and dry  LABS:  BMET  Recent Labs Lab 07/29/17 1754 07/29/17 2050 07/30/17 0440  NA 129* 131* 133*  K 3.6 3.3* 3.3*  CL 88* 94* 99*  CO2 BUN CREATININE 1.11 0.84 0.70  GLUCOSE 294* 266* 239*    Electrolytes  Recent Labs Lab 07/29/17 1754 07/29/17 2050 07/30/17 0440  CALCIUM 9.3 8.6* 8.2*  MG  --  1.6*  --   PHOS  --  3.0  --     CBC  Recent Labs Lab 07/29/17 1754 07/30/17 0440  WBC 14.2* 9.6  HGB 15.8 13.2  HCT 43.6 36.6*  PLT 258 191    Coag's No results for input(s): APTT, INR in the last 168 hours.  Sepsis Markers  Recent Labs Lab 07/29/17 2017  LATICACIDVEN 1.0    ABG  Recent Labs Lab 07/29/17 2048  PHART 7.38  PCO2ART 40  PO2ART 81*    Liver Enzymes  Recent Labs Lab 07/29/17 1754 07/30/17 0440  AST 25 15  ALT 35 25  ALKPHOS 62 43  BILITOT 1.3* 0.8  ALBUMIN 4.3 3.2*    Cardiac Enzymes No results for input(s): TROPONINI, PROBNP in the last 168 hours.  Glucose  Recent Labs Lab 07/29/17 2022 07/29/17 2120 07/29/17 2229 07/29/17 2310 07/29/17 2350  GLUCAP 275* 259* 243* 232* 225*    Imaging Ct Renal Stone Study  Result Date: 07/29/2017 CLINICAL DATA:  Abdominal pain extending to back EXAM: CT ABDOMEN AND PELVIS WITHOUT CONTRAST TECHNIQUE: Multidetector CT imaging of the abdomen and pelvis was performed following the standard protocol without oral or intravenous contrast material administration. COMPARISON:  June 25, 2014 FINDINGS: Lower chest: Lung bases are clear. Hepatobiliary: Liver measures 22.2 cm in length. There is hepatic steatosis. Gallbladder is absent. There is no biliary duct dilatation. Pancreas: There is soft tissue stranding in the mesenteric adjacent to the tail of the pancreas. The pancreas  is not enlarged, and there is no mass or abnormal fluid. No pancreatic duct dilatation. Remainder of pancreas appears normal. Spleen: Spleen measures 13.9 x 11.9 x 6.7 cm with a measured splenic volume of 554 cubic cm. No focal splenic lesions are evident. Adrenals/Urinary Tract: Adrenals appear normal bilaterally. Kidneys bilaterally show no evident mass or hydronephrosis on either side. There is no appreciable renal or ureteral calculus on either side. Urinary bladder is midline with wall thickness within normal limits. Stomach/Bowel: There is no appreciable bowel wall or mesenteric thickening. There is no appreciable bowel obstruction. No evident free air or portal venous air. There is lipomatous infiltration of the ileocecal valve. Vascular/Lymphatic: There is no abdominal aortic aneurysm. No evident vascular lesions on this noncontrast enhanced study. There is no appreciable adenopathy in the abdomen or pelvis. Reproductive: Prostate and seminal vesicles are normal in size and contour. There is no pelvic mass. Other: Appendix appears normal. There is no abscess or ascites in the abdomen or pelvis. Musculoskeletal: There are no appreciable blastic or lytic bone lesions. There is no intramuscular or abdominal wall lesion. IMPRESSION: 1. Soft tissue stranding in the mesenteric adjacent to the tail of the pancreas. Suspect early acute pancreatitis in this area. Appropriate laboratory correlation advised. No pancreatic mass or fluid collection. No pancreatic duct dilatation. 2. Hepatic and splenic prominence. There is hepatic steatosis. Gallbladder is absent. No biliary duct dilatation evident. 3.  No bowel obstruction.  No abscess.  Appendix appears normal. 4.  No renal or ureteral calculus.  No evident hydronephrosis. These results will be called to the ordering clinician or representative by the Radiologist Assistant, and communication documented in the PACS or zVision Dashboard. Electronically Signed   By:  Bretta Bang III M.D.   On: 07/29/2017 16:28    STUDIES:  none  CULTURES: none  ANTIBIOTICS: none  SIGNIFICANT EVENTS: 07/29/2017: Admitted  LINES/TUBES: Peripheral IVs  DISCUSSION: 41 year old Caucasian male presenting with DKA, hypokalemia and acute pancreatitis  ASSESSMENT Hyperosmolar hyperglycemic state Acute pancreatitis Hypokalemia Mild leukocytosis hyponatremia H/O T2DM H/O Hyperlipidemia H/O Hypertension  PLAN Hemodynamics per ICU protocol D/C IV insulin Lantus and SQ insulin Q4H Monitor and replace electrolytes IV fluids Pain management Trend amylase, lipase and LFTs. Hemoglobin A1c and lipid panel in a.m. Trend CBC and monitor fever curve We'll hold off on antibiotics for now Hold off on home oral antidiabetic medications. GI and DVT prophylaxis  FAMILY  - Updates: patient updated at bedside on current treatment plan  - Inter-disciplinary family meet or Palliative Care meeting due by:  day 7    Kevin Maynard ANP-BC Pulmonary and Critical Care Medicine [facility] Pager [phone] or [phone]  07/30/2017, 6:40 AM   STAFF NOTE: I. Dr. Nicholos Johns, have personally reviewed the patient's available data including medical history , events of notes, physican examination and test results as part of my evaluation. I have discussed with the  Care with the NP and other care providers  including  pharmacist, ICU RN, RRT, dietary.  Physical Exam Lungs - Decreased air entry bilaterally.     Patient was seen by his primary care provider and found to have hematuria. He was then referred for a CT abdomen to rule out kidney stones His CT abdomen suggested acute pancreatitis. He was referred to the emergency room for further evaluation. Patient states that symptoms started gradually and got worse over the course of 40s. He denies any fever, chills, bloody stools and bloody emesis. At the ED,his lipase level was elevated at 18, and he was  also hyperglycemic. He was started on an insulin drip and admitted to the ICU.  Will continue to wean down/off insulin drip, triglycerides down to 1367. Utox positive for MDMA.   Wells Guiles, MD.   Board Certified in Internal Medicine, Pulmonary Medicine, Critical Care Medicine, and Sleep Medicine.  [facility] Office Number: [phone] Pager: [phone]  Santiago Glad, M.D.  Billy Fischer, M.D 07/30/2017

## 2017-07-31 LAB — GLUCOSE, CAPILLARY
GLUCOSE-CAPILLARY: 277 mg/dL — AB (ref 65–99)
GLUCOSE-CAPILLARY: 410 mg/dL — AB (ref 65–99)
Glucose-Capillary: 235 mg/dL — ABNORMAL HIGH (ref 65–99)
Glucose-Capillary: 313 mg/dL — ABNORMAL HIGH (ref 65–99)
Glucose-Capillary: 411 mg/dL — ABNORMAL HIGH (ref 65–99)
Glucose-Capillary: 410 mg/dL — ABNORMAL HIGH (ref 65–99)

## 2017-07-31 LAB — BASIC METABOLIC PANEL
ANION GAP: 7 (ref 5–15)
BUN: 9 mg/dL (ref 6–20)
CHLORIDE: 103 mmol/L (ref 101–111)
CO2: 25 mmol/L (ref 22–32)
Calcium: 8.5 mg/dL — ABNORMAL LOW (ref 8.9–10.3)
Creatinine, Ser: 0.78 mg/dL (ref 0.61–1.24)
GFR calc non Af Amer: >60 mL/min (ref >60)
Glucose, Bld: 231 mg/dL — ABNORMAL HIGH (ref 65–99)
Potassium: 4.2 mmol/L (ref 3.5–5.1)
Sodium: 135 mmol/L (ref 135–145)

## 2017-07-31 LAB — HIV ANTIBODY (ROUTINE TESTING W REFLEX): HIV SCREEN 4TH GENERATION: NONREACTIVE

## 2017-07-31 LAB — TRIGLYCERIDES: Triglycerides: 1072 mg/dL — ABNORMAL HIGH (ref <150)

## 2017-07-31 MED ORDER — INSULIN STARTER KIT- SYRINGES (ENGLISH)
1.0000 | Freq: Once | Status: AC
  Administered 2017-07-31: 1
  Filled 2017-07-31: qty 1

## 2017-07-31 MED ORDER — INSULIN ASPART 100 UNIT/ML ~~LOC~~ SOLN
0.0000 [IU] | Freq: Three times a day (TID) | SUBCUTANEOUS | Status: DC
  Administered 2017-07-31: 9 [IU] via SUBCUTANEOUS
  Administered 2017-08-01: 5 [IU] via SUBCUTANEOUS
  Administered 2017-08-01: 3 [IU] via SUBCUTANEOUS
  Filled 2017-07-31 (×3): qty 1

## 2017-07-31 MED ORDER — INSULIN GLARGINE 100 UNIT/ML ~~LOC~~ SOLN
22.0000 [IU] | Freq: Every day | SUBCUTANEOUS | Status: DC
  Administered 2017-07-31: 22 [IU] via SUBCUTANEOUS
  Filled 2017-07-31 (×2): qty 0.22

## 2017-07-31 NOTE — Progress Notes (Signed)
Spoke with Dr.Willis about patient's fingerstick of 410 after recheck. Dr. Nila Nephew verbalized he would put in new orders for sliding scale and order to recheck fingerstick at 3 am . Anselm Jungling

## 2017-07-31 NOTE — Progress Notes (Signed)
Initial Nutrition Assessment  DOCUMENTATION CODES:   Obesity unspecified  INTERVENTION:  Reviewed "Carbohydrate Counting for People with Diabetes" and "Using Nutrition Labels: Carbohydrates" handouts from the Academy of Nutrition and Dietetics with patient and his wife (note to follow).  Patient does not meet criteria for malnutrition, so no intervention indicated. Encouraged intake of well-balanced meals that include adequate protein during education provided.  Patient amenable to receive outpatient nutrition counseling to promote lifestyle changes. Made referral.  NUTRITION DIAGNOSIS:   Food and nutrition related knowledge deficit related to limited prior education as evidenced by per patient/family report.  GOAL:   Patient will meet greater than or equal to 90% of their needs  MONITOR:   PO intake, Labs, Weight trends, I & O's  REASON FOR ASSESSMENT:   Malnutrition Screening Tool    ASSESSMENT:   41 year old male with PMHx of HTN, OSA, DM type 2, HLD, GERD who presented with 4 day history of abdominal pain with N/V and diarrhea admitted for DKA and acute pancreatitis. 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   Spoke with patient and his wife at bedside. Patient reports he has a good appetite now and also had a good appetite PTA. He reports he stays hungry all day long and frequently snacks during the day. He does still eat three good meals in addition to snacks. Breakfast is usually a fried chicken biscuit or another type of biscuit from fast food place. Lunch may be leftovers from dinner, a wrap made with spinach wrap, or he may buy two hotdogs. Dinner is usually a meat with vegetables and a carb. He reports they are eating less pasta now (used to eat a lot of pasta) and have replaced mashed potatoes with cauliflower mash, which the whole family enjoys. His snacks are usually sunflower seeds. He drinks water mainly, but will occasionally have a diet soda or a  diet energy drink. He does not drink any sugar-sweetened beverages during the day.  No recent weight history in chart (last weights from approximately 1 year ago). Patient reports his UBW is 245 lbs. He reports he lost 8 lbs (3.3% body weight) over the past week, which is significant for time frame.   Meal Completion: 100%  Medications reviewed and include: Lipitor 80 mg daily, fenofibrate 160 mg daily, Novolog 0-9 units TID, Novolog 0-5 units QHS, Lantus 22 units QHS, pantoprazole.  Labs reviewed: CBG 192-277 past 24 hrs, Triglycerides 1072 (trending down from 1576 on 9/22). Lipase was 31 on 9/22 (was only slightly elevated at 62 on 9/21). HgbA1c 11.8 on 9/22.  Nutrition-Focused physical exam completed. Findings are no fat depletion, no muscle depletion, and no edema.   Patient does not meet criteria for malnutrition at this time. His weight loss most likely related to uncontrolled diabetes or dehydration in setting of N/V and diarrhea. Suspect if weight was rechecked now that patient is re-hydrated, would be closer to his UBW.  Discussed with RN.  Diet Order:  Diet Carb Modified Fluid consistency: Thin; Room service appropriate? Yes  Skin:  Reviewed, no issues  Last BM:  07/31/2017  Height:   Ht Readings from Last 1 Encounters:  07/29/17  (1.778 m)    Weight:   Wt Readings from Last 1 Encounters:  07/29/17 236 lb 15.9 oz (107.5 kg)    Ideal Body Weight:  75.5 kg  BMI:  Body mass index is 34.01 kg/m.  Estimated Nutritional Needs:   Kcal:  2200-2400 (MSJ x 1.1-1.2)  Protein:  95-110 grams (0.9-1 grams/kg)  Fluid:  2.3-2.6 L/day (30-35 ml/kg IBW)  EDUCATION NEEDS:   Education needs addressed  Helane Rima, MS, RD, LDN Pager: [phone] After Hours Pager: [phone]

## 2017-07-31 NOTE — Progress Notes (Signed)
[facility] [place] [facility]   PATIENT NAME: Kevin Maynard    MR#:  [id number]  DATE OF BIRTH:  09/03/1976  SUBJECTIVE:  CHIEF COMPLAINT:   Chief Complaint  Patient presents with  . Pancreatitis   -Feels much better today. Still having occasional abdominal pain. -Triglycerides are still elevated. Sugars are in the 200 range. Wife at bedside  REVIEW OF SYSTEMS:  Review of Systems  Constitutional: Negative for chills, fever and malaise/fatigue.  HENT: Negative for congestion, ear discharge, hearing loss and nosebleeds.   Eyes: Negative for blurred vision and double vision.  Respiratory: Negative for cough, shortness of breath and wheezing.   Cardiovascular: Negative for chest pain, palpitations and leg swelling.  Gastrointestinal: Positive for abdominal pain. Negative for constipation, diarrhea, nausea and vomiting.  Genitourinary: Negative for dysuria.  Musculoskeletal: Negative for myalgias.  Neurological: Negative for dizziness, sensory change, speech change, focal weakness, seizures and headaches.  Psychiatric/Behavioral: Negative for depression.    DRUG ALLERGIES:  No Known Allergies  VITALS:  Blood pressure 109/67, pulse 75, temperature 98.1 F (36.7 C), temperature source Oral, resp. rate 18, height  (1.778 m), weight 107.5 kg (236 lb 15.9 oz), SpO2 96 %.  PHYSICAL EXAMINATION:  Physical Exam  GENERAL:  41 y.o.-year-old patient lying in the bed with no acute distress.  EYES: Pupils equal, round, reactive to light and accommodation. No scleral icterus. Extraocular muscles intact.  HEENT: Head atraumatic, normocephalic. Oropharynx and nasopharynx clear.  NECK:  Supple, no jugular venous distention. No thyroid enlargement, no tenderness.  LUNGS: Normal breath sounds bilaterally, no wheezing, rales,rhonchi or crepitation. No use of accessory muscles of respiration.  CARDIOVASCULAR: S1, S2 normal. No murmurs, rubs, or gallops.  ABDOMEN:  Soft, minimal tenderness in LUQ and mid abdominal region,, nondistended. Bowel sounds present. No organomegaly or mass.  EXTREMITIES: No pedal edema, cyanosis, or clubbing.  NEUROLOGIC: Cranial nerves II through XII are intact. Muscle strength 5/5 in all extremities. Sensation intact. Gait not checked.  PSYCHIATRIC: The patient is alert and oriented x 3.  SKIN: No obvious rash, lesion, or ulcer.    LABORATORY PANEL:   CBC  Recent Labs Lab 07/30/17 0440  WBC 9.6  HGB 13.2  HCT 36.6*  PLT 191   ------------------------------------------------------------------------------------------------------------------  Chemistries   Recent Labs Lab 07/29/17 2050 07/30/17 0440 07/31/17 0442  NA 131* 133* 135  K 3.3* 3.3* 4.2  CL 94* 99* 103  CO2 GLUCOSE 266* 239* 231*  BUN CREATININE 0.84 0.70 0.78  CALCIUM 8.6* 8.2* 8.5*  MG 1.6*  --   --   AST  --  15  --   ALT  --  25  --   ALKPHOS  --  43  --   BILITOT  --  0.8  --    ------------------------------------------------------------------------------------------------------------------  Cardiac Enzymes No results for input(s): TROPONINI in the last 168 hours. ------------------------------------------------------------------------------------------------------------------  RADIOLOGY:  Ct Renal Stone Study  Result Date: 07/29/2017 CLINICAL DATA:  Abdominal pain extending to back EXAM: CT ABDOMEN AND PELVIS WITHOUT CONTRAST TECHNIQUE: Multidetector CT imaging of the abdomen and pelvis was performed following the standard protocol without oral or intravenous contrast material administration. COMPARISON:  June 25, 2014 FINDINGS: Lower chest: Lung bases are clear. Hepatobiliary: Liver measures 22.2 cm in length. There is hepatic steatosis. Gallbladder is absent. There is no biliary duct dilatation. Pancreas: There is soft tissue stranding in the mesenteric adjacent to the tail of  the pancreas. The pancreas is not  enlarged, and there is no mass or abnormal fluid. No pancreatic duct dilatation. Remainder of pancreas appears normal. Spleen: Spleen measures 13.9 x 11.9 x 6.7 cm with a measured splenic volume of 554 cubic cm. No focal splenic lesions are evident. Adrenals/Urinary Tract: Adrenals appear normal bilaterally. Kidneys bilaterally show no evident mass or hydronephrosis on either side. There is no appreciable renal or ureteral calculus on either side. Urinary bladder is midline with wall thickness within normal limits. Stomach/Bowel: There is no appreciable bowel wall or mesenteric thickening. There is no appreciable bowel obstruction. No evident free air or portal venous air. There is lipomatous infiltration of the ileocecal valve. Vascular/Lymphatic: There is no abdominal aortic aneurysm. No evident vascular lesions on this noncontrast enhanced study. There is no appreciable adenopathy in the abdomen or pelvis. Reproductive: Prostate and seminal vesicles are normal in size and contour. There is no pelvic mass. Other: Appendix appears normal. There is no abscess or ascites in the abdomen or pelvis. Musculoskeletal: There are no appreciable blastic or lytic bone lesions. There is no intramuscular or abdominal wall lesion. IMPRESSION: 1. Soft tissue stranding in the mesenteric adjacent to the tail of the pancreas. Suspect early acute pancreatitis in this area. Appropriate laboratory correlation advised. No pancreatic mass or fluid collection. No pancreatic duct dilatation. 2. Hepatic and splenic prominence. There is hepatic steatosis. Gallbladder is absent. No biliary duct dilatation evident. 3.  No bowel obstruction.  No abscess.  Appendix appears normal. 4.  No renal or ureteral calculus.  No evident hydronephrosis. These results will be called to the ordering clinician or representative by the Radiologist Assistant, and communication documented in the PACS or zVision Dashboard. Electronically Signed   By: Bretta Bang III M.D.   On: 07/29/2017 16:28    EKG:   Orders placed or performed during the hospital encounter of 07/15/16  . EKG 12-Lead  . EKG 12-Lead    ASSESSMENT AND PLAN:   41 year old type II diabetic with hypertension, sleep apnea and peptic ulcer disease presents to the hospital secondary to nausea, vomiting and abdominal pain.  #1 Unontrolled diabetes mellitus with elevated anion gap- hba1c of 11.8 - received IV fluids, and  insulin drip. Currently off of both. -he is started on SQ lantus- dose being adjusted and sliding scale insulin. -On metformin and Victoza at home. Some noncompliance has been identified.  #2 acute pancreatitis-significantly elevated triglycerides which are the cause for his pancreatitis. -Received IV insulin on admission, currently off insulin. Now started on statin and TriCor. -Ideally we keep the insulin drip until the triglycerides are less than thousand -Monitor closely.  #3 hematuria-none currently, urine RBCs are less than 5. No further testing needed.  #4 hypertriglyceridemia-likely hereditary, patient is adopted so unaware of any family history. -Started on statin and TriCor.  #5 hypokalemia-replaced  #6 DVT prophylaxis-on Lovenox  Possible discharge tomorrow    All the records are reviewed and case discussed with Care Management/Social Workerr. Management plans discussed with the patient, family and they are in agreement.  CODE STATUS: Full Code  TOTAL TIME TAKING CARE OF THIS PATIENT: 37 minutes.   POSSIBLE D/C TOMORROW, DEPENDING ON CLINICAL CONDITION.   Enid Baas M.D on 07/31/2017 at 9:58 AM  Between 7am to 6pm - Pager - [phone]  After 6pm go to [email or web address] - Social research officer, government  [facility]  Office  [phone]  CC: Primary care physician; Patrice Paradise, MD

## 2017-07-31 NOTE — Plan of Care (Addendum)
Problem: Food- and Nutrition-Related Knowledge Deficit (NB-1.1) Goal: Nutrition education Formal process to instruct or train a patient/client in a skill or to impart knowledge to help patients/clients voluntarily manage or modify food choices and eating behavior to maintain or improve health. Outcome: Completed/Met Date Met: 07/31/17  RD identified need for nutrition education regarding diabetes during initial assessment.   Lab Results  Component Value Date   HGBA1C 11.8 (H) 07/30/2017    RD provided "Carbohydrate Counting for People with Diabetes" handout from the Academy of Nutrition and Dietetics. Discussed different food groups and their effects on blood sugar, emphasizing carbohydrate-containing foods. Provided list of carbohydrates and recommended serving sizes of common foods.  RD provided "Using Nutrition Labels: Carbohydrates" handout from the Academy of Nutrition and Dietetics. Discussed how to read a nutrition label including looking at serving size and servings per container. Reviewed that there are 15 grams of carbohydrate in one carbohydrate choice. Encouraged patient to use chart for range of carbohydrate grams per choice when reading nutrition labels.  Discussed importance of controlled and consistent carbohydrate intake throughout the day. Provided examples of ways to balance meals/snacks and encouraged intake of high-fiber, whole grain complex carbohydrates. Teach back method used.  Expect good compliance. Patient would still benefit from outpatient counseling from RD. He is amenable to this.  Body mass index is 34.01 kg/m. Pt meets criteria for obesity class I based on current BMI.  Current diet order is Carbohydrate Modified, patient is consuming approximately 100% of meals at this time. Labs and medications reviewed. No further nutrition interventions warranted at this time. RD contact information provided. If additional nutrition issues arise, please re-consult  RD.  Willey Blade, MS, RD, LDN Pager: [phone] After Hours Pager: [phone]

## 2017-08-01 LAB — BASIC METABOLIC PANEL
ANION GAP: 9 (ref 5–15)
BUN: 18 mg/dL (ref 6–20)
CHLORIDE: 101 mmol/L (ref 101–111)
CO2: 28 mmol/L (ref 22–32)
Calcium: 9.4 mg/dL (ref 8.9–10.3)
Creatinine, Ser: 0.8 mg/dL (ref 0.61–1.24)
Glucose, Bld: 98 mg/dL (ref 65–99)
POTASSIUM: 4.3 mmol/L (ref 3.5–5.1)
SODIUM: 138 mmol/L (ref 135–145)

## 2017-08-01 LAB — GLUCOSE, CAPILLARY
GLUCOSE-CAPILLARY: 268 mg/dL — AB (ref 65–99)
GLUCOSE-CAPILLARY: 224 mg/dL — AB (ref 65–99)
Glucose-Capillary: 239 mg/dL — ABNORMAL HIGH (ref 65–99)
Glucose-Capillary: 253 mg/dL — ABNORMAL HIGH (ref 65–99)
Glucose-Capillary: 252 mg/dL — ABNORMAL HIGH (ref 65–99)
Glucose-Capillary: 293 mg/dL — ABNORMAL HIGH (ref 65–99)
Glucose-Capillary: 294 mg/dL — ABNORMAL HIGH (ref 65–99)

## 2017-08-01 LAB — TRIGLYCERIDES
TRIGLYCERIDES: 100 mg/dL (ref <150)
TRIGLYCERIDES: 939 mg/dL — AB (ref <150)

## 2017-08-01 LAB — GLUCOSE, RANDOM: GLUCOSE: 261 mg/dL — AB (ref 65–99)

## 2017-08-01 MED ORDER — INSULIN GLARGINE 100 UNIT/ML ~~LOC~~ SOLN
30.0000 [IU] | Freq: Every day | SUBCUTANEOUS | Status: DC
  Filled 2017-08-01: qty 0.3

## 2017-08-01 MED ORDER — ATORVASTATIN CALCIUM 80 MG PO TABS: 80.0000 mg | ORAL_TABLET | Freq: Every day | ORAL | 2 refills | Status: DC

## 2017-08-01 MED ORDER — INSULIN ASPART 100 UNIT/ML ~~LOC~~ SOLN
4.0000 [IU] | Freq: Three times a day (TID) | SUBCUTANEOUS | 11 refills | Status: DC
Start: 2017-08-01 — End: 2017-08-01

## 2017-08-01 MED ORDER — INSULIN ASPART 100 UNIT/ML ~~LOC~~ SOLN
4.0000 [IU] | Freq: Three times a day (TID) | SUBCUTANEOUS | Status: DC
  Administered 2017-08-01: 4 [IU] via SUBCUTANEOUS
  Filled 2017-08-01: qty 1

## 2017-08-01 MED ORDER — INSULIN GLARGINE 100 UNIT/ML SOLOSTAR PEN: 30.0000 [IU] | PEN_INJECTOR | Freq: Every day | SUBCUTANEOUS | 11 refills | Status: AC

## 2017-08-01 MED ORDER — BLOOD GLUCOSE MONITOR KIT: PACK | 0 refills | Status: AC

## 2017-08-01 MED ORDER — PEN NEEDLES 31G X 6 MM MISC: 1 refills | Status: AC

## 2017-08-01 NOTE — Progress Notes (Addendum)
Met with patient earlier this afternoon to discuss discharge plan on insulin.  Explained what Lantus is, how it works, when to take, etc.  Patient very familiar with Victoza injections so he already knows how to use a pen injection.  Verbally reviewed use of Lantus pen with pt and reminded him to prime with 2 units with each injection and to hold in skin for 10 seconds after injection.  Discussed DM diet information with patient.  Encouraged patient to avoid beverages with sugar (regular soda, sweet tea, lemonade, fruit juice) and to consume mostly water.  Discussed what foods contain carbohydrates and how carbohydrates affect the body's blood sugar levels.  Encouraged patient to be careful with his portion sizes (especially grains, starchy vegetables, and fruits).  Explained to patient that men should have 60-75 grams of carbohydrates per meal per day.  Also Discussed basic carbohydrate counting with patient and how to read a food label.   Patient had questions about using the Freestyle Libre glucose meter at home.  Explained to pt that Dr. Tressia Miners has given him a Rx for a basic CBG meter and that the pharmacy will give him a CBG meter that is covered by his insurance.  Discussed with pt what a Freestyle Libre meter is, how it works, Social research officer, government.  Encouraged pt to ask Dr. Gabriel Carina (ENDO) about getting a Rx to start a Libre meter at home.  Encouraged pt to check his CBGs at least 3-4 times per day (before meals and sometimes 2-hours after meals) and reminded pt to take all his Blood glucose data with him to his appt with Dr. Gabriel Carina.  Also reviewed CBG goals and A1c goals for home.  Patient was very motivated to take better care of his CBGs.  Expect good compliance with regimen.     --Will follow patient during hospitalization--  Wyn Quaker RN, MSN, CDE Diabetes Coordinator Inpatient Glycemic Control Team Team Pager: [phone] (8a-5p)

## 2017-08-01 NOTE — Discharge Summary (Signed)
[facility] [place] NAME: Kevin Maynard    MR#:  [id number]  DATE OF BIRTH:  03-01-1976  DATE OF ADMISSION:  07/29/2017   ADMITTING PHYSICIAN: Demetrios Loll, MD  DATE OF DISCHARGE: 08/01/2017  PRIMARY CARE PHYSICIAN: Marinda Elk, MD   ADMISSION DIAGNOSIS:   Hyponatremia [E87.1] Diabetic ketoacidosis without coma associated with type 2 diabetes mellitus ([place]) [E11.10] Other acute pancreatitis, unspecified complication status [G66.59]  DISCHARGE DIAGNOSIS:   Active Problems:   DKA (diabetic ketoacidoses) (HCC)   Hyponatremia   Acute pancreatitis   Hypokalemia   SECONDARY DIAGNOSIS:   Past Medical History:  Diagnosis Date  . Abdominal pain, right upper quadrant 08/06/2014  . Ankle fracture   . BP (high blood pressure) 08/06/2014  . Controlled type 2 diabetes mellitus without complication ([place]) [id number]/57/0177  . 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 GERD (gastroesophageal reflux disease)   . HLD (hyperlipidemia) 08/06/2014  . Obstructive apnea 01/13/2016  . Stomach ulcer   . Type 2 diabetes mellitus with hyperglycemia ([place]) 08/06/2014    HOSPITAL COURSE:   41 year old type II diabetic with hypertension, sleep apnea and peptic ulcer disease presents to the hospital secondary to nausea, vomiting and abdominal pain.  #1 Unontrolled diabetes mellitus with elevated anion gap- hba1c of 11.8 - received IV fluids, and  insulin drip. Currently off of both. -he is started on SQ lantus- dose being adjusted and sliding scale insulin. -On metformin and Victoza at home. Metformin can be continued at discharge. Discontinue Victoza at this pancreatitis - discharged on Lantus 30 units subcutaneously at bedtime. Discussed this with his endocrinologist and she will monitor and adjust further as outpatient and also will start pre-meal insulin if needed as outpatient.  #2 acute pancreatitis-significantly elevated triglycerides  which are the cause for his pancreatitis. -Received IV insulin on admission, currently off insulin.  - started on high dose statin and TriCor. -needs better sugar control as well. Diet and healthy life style changes discussed - off victoza. Triglycerides at 900 at discharge. Patient is asymptomatic and tolerating diet well. Advised to low-cholesterol low-fat diet  #3 hematuria-none currently, urine RBCs are less than 5. No further testing needed.  #4 hypertriglyceridemia-likely hereditary, patient is adopted so unaware of any family history. -on statin and TriCor.  #5 hypokalemia-replaced   Stable for discharge today  DISCHARGE CONDITIONS:   Guarded CONSULTS OBTAINED:    none  DRUG ALLERGIES:   No Known Allergies DISCHARGE MEDICATIONS:   Allergies as of 08/01/2017   No Known Allergies     Medication List    STOP taking these medications   pravastatin 40 MG tablet Commonly known as:  PRAVACHOL   VICTOZA 18 MG/3ML Sopn Generic drug:  liraglutide     TAKE these medications   atorvastatin 80 MG tablet Commonly known as:  LIPITOR Take 1 tablet (80 mg total) by mouth daily at 6 PM.   benazepril-hydrochlorthiazide 20-25 MG tablet Commonly known as:  LOTENSIN HCT Take 1 tablet by mouth daily.   blood glucose meter kit and supplies Kit Dispense based on patient and insurance preference. Use up to four times daily as directed. (FOR ICD-9 250.00, 250.01).   buPROPion 150 MG 24 hr tablet Commonly known as:  WELLBUTRIN XL Take 150 mg by mouth 2 (two) times daily.   escitalopram 10 MG tablet Commonly known as:  LEXAPRO Take 10 mg by mouth daily.   fenofibrate 160 MG tablet Take 160 mg by mouth  daily.   Insulin Glargine 100 UNIT/ML Solostar Pen Commonly known as:  LANTUS SOLOSTAR Inject 30 Units into the skin at bedtime.   metFORMIN 500 MG tablet Commonly known as:  GLUCOPHAGE Take 500-1,000 mg by mouth 2 (two) times daily with a meal. 500 mg every morning  and 1000 mg at bedtime   pantoprazole 40 MG tablet Commonly known as:  PROTONIX Take 40 mg by mouth daily.   Pen Needles 31G X 6 MM Misc Use as directed            Discharge Care Instructions        Start     Ordered   08/01/17 0000  atorvastatin (LIPITOR) 80 MG tablet  Daily-1800     08/01/17 1156   08/01/17 0000  blood glucose meter kit and supplies KIT    Question Answer Comment  Number of strips 100   Number of lancets 100      08/01/17 1156   08/01/17 0000  Insulin Glargine (LANTUS SOLOSTAR) 100 UNIT/ML Solostar Pen  Daily at bedtime     08/01/17 1156   08/01/17 0000  Insulin Pen Needle (PEN NEEDLES) 31G X 6 MM MISC     08/01/17 1156   08/01/17 0000  Activity as tolerated - No restrictions     08/01/17 1156   08/01/17 0000  Diet Carb Modified     08/01/17 1156   07/31/17 0000  Referral to Nutrition and Diabetes Services    Question Answer Comment  Choose type of Diabetes Self-Management Training (DSMT) training services and number of hours requested Initial DSMT: 10 hours   Check all special needs that apply to patient requiring 1 on 1 DSMT Does not apply   DSMT Content Comprehensive self-management skills- All of the content areas   Choose the type of Medical Nutrition Therapy (MNT) and number of hours Initial MNT: 3 hours   FOR MEDICARE PATIENTS: I hereby certify that I am managing this beneficiary's diabetes condition and that the above prescribed training is a necessary part of management. Does not apply      07/31/17 1527       DISCHARGE INSTRUCTIONS:   1. Endocrinology follow-up in 1-2 weeks   2. Please follow up in 2 weeks  DIET:   Diabetic diet  ACTIVITY:   Activity as tolerated  OXYGEN:   Home Oxygen: No.  Oxygen Delivery: room air  DISCHARGE LOCATION:   home   If you experience worsening of your admission symptoms, develop shortness of breath, life threatening emergency, suicidal or homicidal thoughts you must seek medical attention  immediately by calling 911 or calling your MD immediately  if symptoms less severe.  You Must read complete instructions/literature along with all the possible adverse reactions/side effects for all the Medicines you take and that have been prescribed to you. Take any new Medicines after you have completely understood and accpet all the possible adverse reactions/side effects.   Please note  You were cared for by a hospitalist during your hospital stay. If you have any questions about your discharge medications or the care you received while you were in the hospital after you are discharged, you can call the unit and asked to speak with the hospitalist on call if the hospitalist that took care of you is not available. Once you are discharged, your primary care physician will handle any further medical issues. Please note that NO REFILLS for any discharge medications will be authorized once you are discharged, as  it is imperative that you return to your primary care physician (or establish a relationship with a primary care physician if you do not have one) for your aftercare needs so that they can reassess your need for medications and monitor your lab values.    On the day of Discharge:  VITAL SIGNS:   Blood pressure 120/87, pulse 64, temperature (!) 97.5 F (36.4 C), temperature source Oral, resp. rate 20, height _0  (1.778 m), weight 107.5 kg (236 lb 15.9 oz), SpO2 100 %.  PHYSICAL EXAMINATION:    GENERAL:  41 y.o.-year-old obese patient lying in the bed with no acute distress.  EYES: Pupils equal, round, reactive to light and accommodation. No scleral icterus. Extraocular muscles intact.  HEENT: Head atraumatic, normocephalic. Oropharynx and nasopharynx clear.  NECK:  Supple, no jugular venous distention. No thyroid enlargement, no tenderness.  LUNGS: Normal breath sounds bilaterally, no wheezing, rales,rhonchi or crepitation. No use of accessory muscles of respiration.    CARDIOVASCULAR: S1, S2 normal. No murmurs, rubs, or gallops.  ABDOMEN: Soft, non-tender, non-distended. Bowel sounds present. No organomegaly or mass.  EXTREMITIES: No pedal edema, cyanosis, or clubbing.  NEUROLOGIC: Cranial nerves II through XII are intact. Muscle strength 5/5 in all extremities. Sensation intact. Gait not checked.  PSYCHIATRIC: The patient is alert and oriented x 3.  SKIN: No obvious rash, lesion, or ulcer.   DATA REVIEW:   CBC  Recent Labs Lab 07/30/17 0440  WBC 9.6  HGB 13.2  HCT 36.6*  PLT 191    Chemistries   Recent Labs Lab 07/29/17 2050 07/30/17 0440  08/01/17 0355 08/01/17 0656  NA 131* 133*  < > 138  --   K 3.3* 3.3*  < > 4.3  --   CL 94* 99*  < > 101  --   CO2 23 27  < > 28  --   GLUCOSE 266* 239*  < > 98 261*  BUN 12 10  < > 18  --   CREATININE 0.84 0.70  < > 0.80  --   CALCIUM 8.6* 8.2*  < > 9.4  --   MG 1.6*  --   --   --   --   AST  --  15  --   --   --   ALT  --  25  --   --   --   ALKPHOS  --  43  --   --   --   BILITOT  --  0.8  --   --   --   < > = values in this interval not displayed.   Microbiology Results  Results for orders placed or performed during the hospital encounter of 07/29/17  MRSA PCR Screening     Status: None   Collection Time: 07/29/17 11:10 PM  Result Value Ref Range Status   MRSA by PCR NEGATIVE NEGATIVE Final    Comment:        The GeneXpert MRSA Assay (FDA approved for NASAL specimens only), is one component of a comprehensive MRSA colonization surveillance program. It is not intended to diagnose MRSA infection nor to guide or monitor treatment for MRSA infections.     RADIOLOGY:  No results found.   Management plans discussed with the patient, family and they are in agreement.  CODE STATUS:     Code Status Orders        Start     Ordered   07/29/17 2314  Full code  Continuous  07/29/17 2313    Code Status History    Date Active Date Inactive Code Status Order ID Comments  User Context   This patient has a current code status but no historical code status.      TOTAL TIME TAKING CARE OF THIS PATIENT: 37 minutes.    Gladstone Lighter M.D on 08/01/2017 at 3:21 PM  Between 7am to 6pm - Pager - [phone]  After 6pm go to [email or web address] - Proofreader  [facility]  Office  [phone]  CC: Primary care physician; Marinda Elk, MD   Note: This dictation was prepared with Dragon dictation along with smaller phrase technology. Any transcriptional errors that result from this process are unintentional.

## 2017-08-01 NOTE — Progress Notes (Signed)
Inpatient Diabetes Program Recommendations  AACE/ADA: New Consensus Statement on Inpatient Glycemic Control (2015)  Target Ranges:  Prepandial:   less than 140 mg/dL      Peak postprandial:   less than 180 mg/dL (1-2 hours)      Critically ill patients:  140 - 180 mg/dL   Results for Kevin Maynard, Kevin Maynard (MRN [id number]) as of 08/01/2017 10:45  Ref. Range 07/30/2017 00:37  Hemoglobin A1C Latest Ref Range: 4.8 - 5.6 % 11.8 (H)   Results for Kevin Maynard, Kevin Maynard (MRN [id number]) as of 08/01/2017 10:45  Ref. Range 07/31/2017 07:55 07/31/2017 11:46 07/31/2017 16:58 07/31/2017 21:18 07/31/2017 21:21 07/31/2017 21:41  Glucose-Capillary Latest Ref Range: 65 - 99 mg/dL 782 (H)  5 units Novolog 235 (H)  3 units Novolog 313 (H)  7 units Novolog 411 (H)  9 units Novolog +  22 units Lantus 410 (H) 410 (H)    Admit with: Hyperglycemia/ Pancreatitis  History: DM2  Home DM Meds: Metformin 500 mg AM/ 1000 mg PM       Victoza 1.8 mg daily  Current Insulin Orders: Lantus 30 units QHS      Novolog Sensitive Correction Scale/ SSI (0-9 units) TID AC + HS       MD- Note patient hasn't seen his Endocrinologist (Dr. Tedd Sias with [name]) since 07/2016.  Current A1c of 11.8% shows poor glucose control at home.  Note Lantus dose increased this AM.  Will get increased dose tonight at bedtime.  Plan to see patient today as he will likely need Insulin at time of discharge.   MD- Please consider the following:  1. Start Novolog Meal Coverage: Novolog 6 units TID with meals (hold if pt eats <50% of meal)  2. May need to discontinue Victoza at time of discharge until patient can follow-up with his Endocrinologist or PCP.  Victoza has a risk of precipitating Pancreatitis.      --Will follow patient during hospitalization--  Ambrose Finland RN, MSN, CDE Diabetes Coordinator Inpatient Glycemic Control Team Team Pager: [phone] (8a-5p)

## 2017-08-01 NOTE — Progress Notes (Signed)
     Kevin Maynard was admitted to the [facility] on 07/29/2017 for an acute medical condition and is being Discharged on  08/01/2017 . He will need another 2 days for recovery and so advised to stay away from work until then. So please excuse him  from work for the above  Days. Should be able to return to work without any restrictions from 08/03/17.  Call Enid Baas  MD, [facility] at  [phone] with questions.  Enid Baas M.D on 08/01/2017,at 1:11 PM  [facility] [address] [place] [address]

## 2017-08-29 ENCOUNTER — Ambulatory Visit: Payer: BLUE CROSS/BLUE SHIELD | Admitting: Dietician

## 2017-09-01 ENCOUNTER — Encounter: Payer: Self-pay | Admitting: Dietician

## 2019-06-09 IMAGING — CT CT RENAL STONE PROTOCOL
2 of 4 series · 15 of 46 positions shown, 17 images · non-contrast
Comparison: June 25, 2014

CLINICAL DATA: Abdominal pain extending to back

EXAM:
CT ABDOMEN AND PELVIS WITHOUT CONTRAST
TECHNIQUE: Multidetector CT imaging of the abdomen and pelvis was performed
following the standard protocol without oral or intravenous contrast
material administration.

[Series 2: stone full standard · axial · 0.84mm/px · z∈[-380,+120]mm · 12 of 111 slices shown, 14 images]
[im 6/111  soft-tissue]
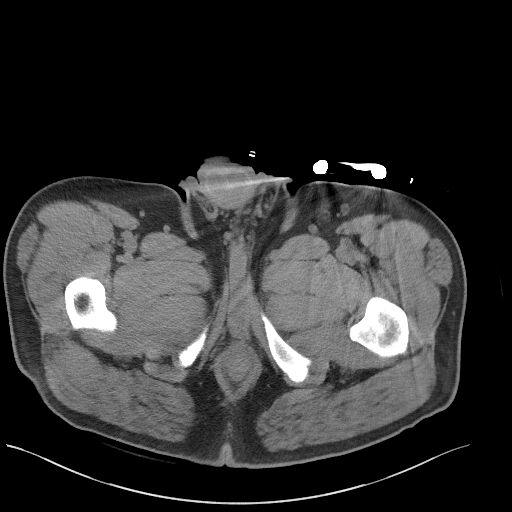
[im 6/111  bone]
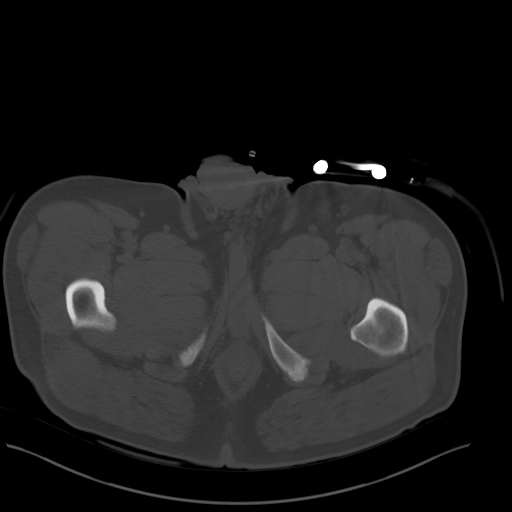
[im 16/111  soft-tissue]
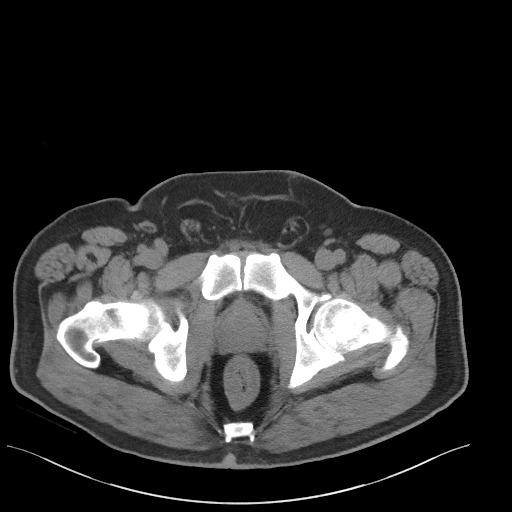
[im 26/111  soft-tissue]
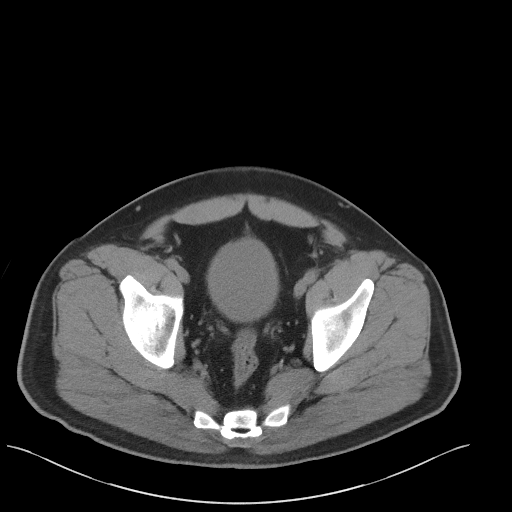
[im 36/111  soft-tissue]
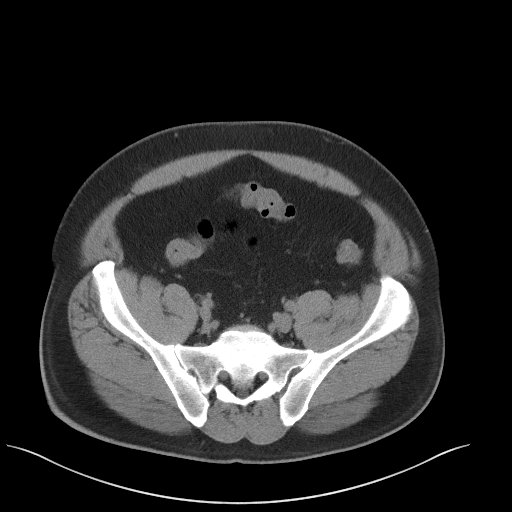
[im 41/111  soft-tissue]
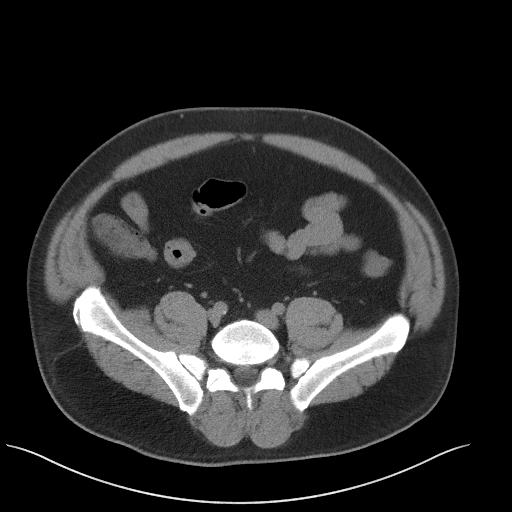
[im 51/111  soft-tissue]
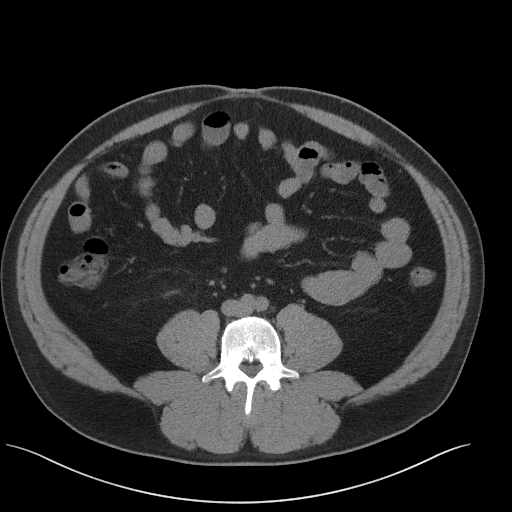
[im 61/111  soft-tissue]
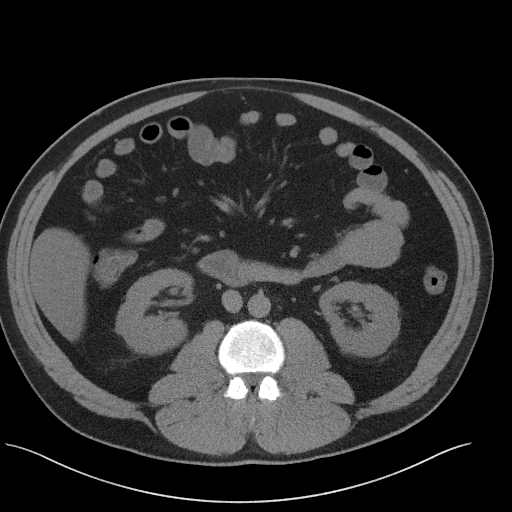
[im 71/111  soft-tissue]
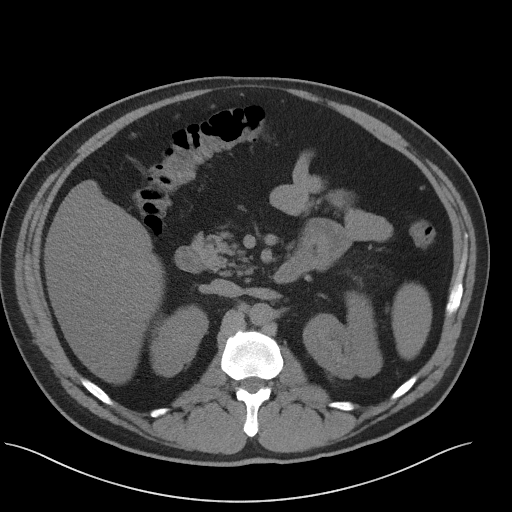
[im 76/111  soft-tissue]
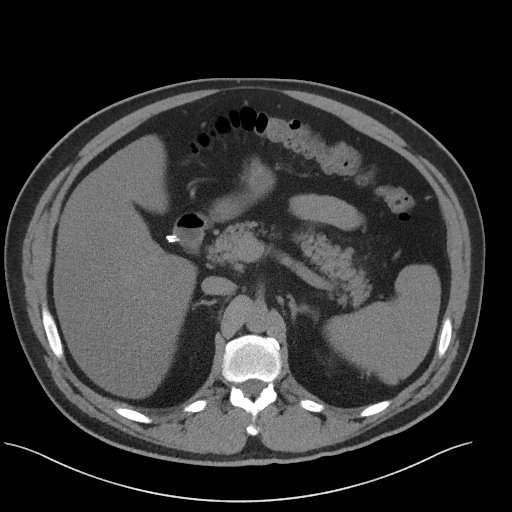
[im 76/111  bone]
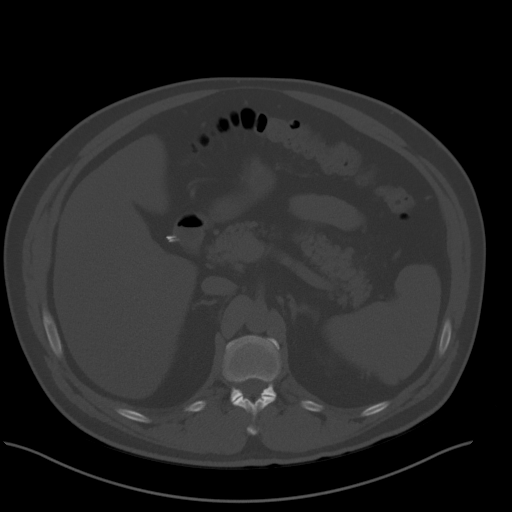
[im 86/111  soft-tissue]
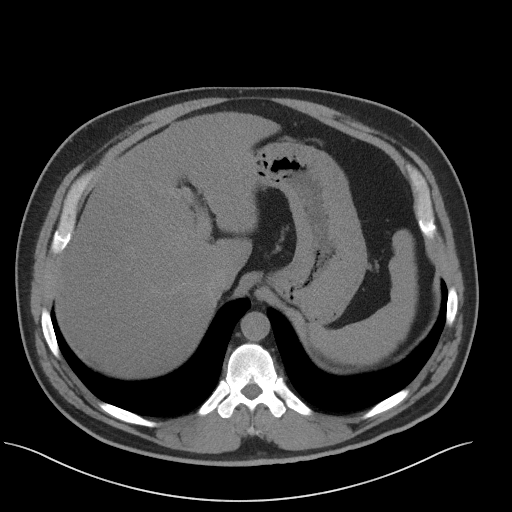
[im 96/111  soft-tissue]
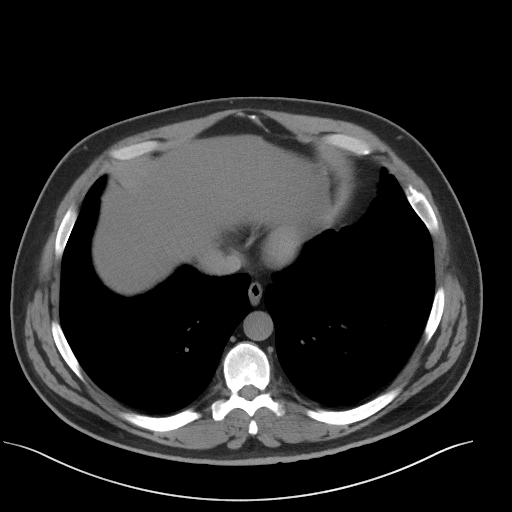
[im 106/111  soft-tissue]
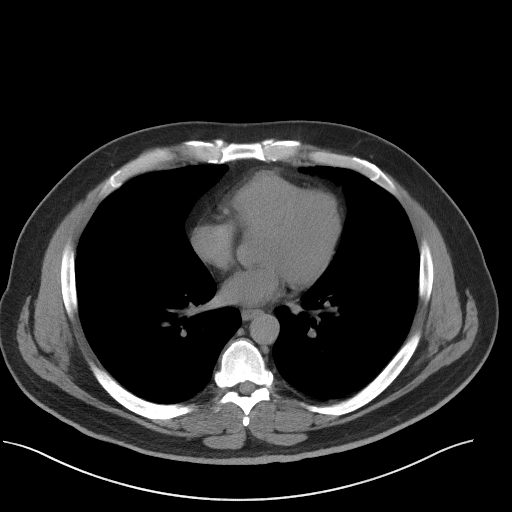

[Series 5: coronal · coronal · 0.87mm/px · 3 of 172 slices shown]
[im 58/172  soft-tissue]
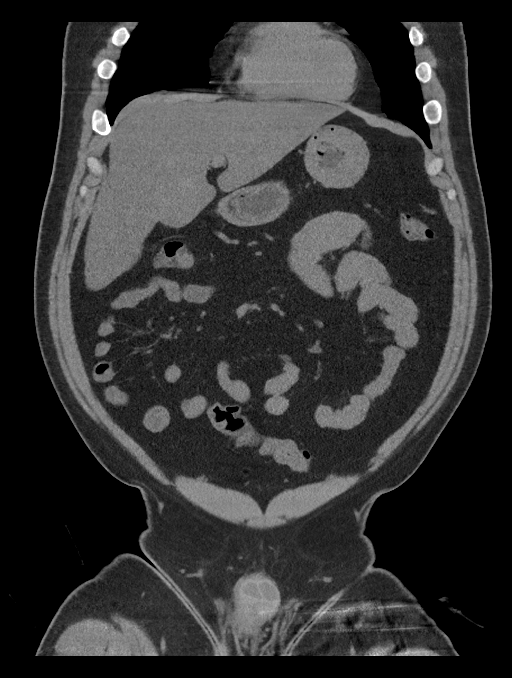
[im 77/172  soft-tissue]
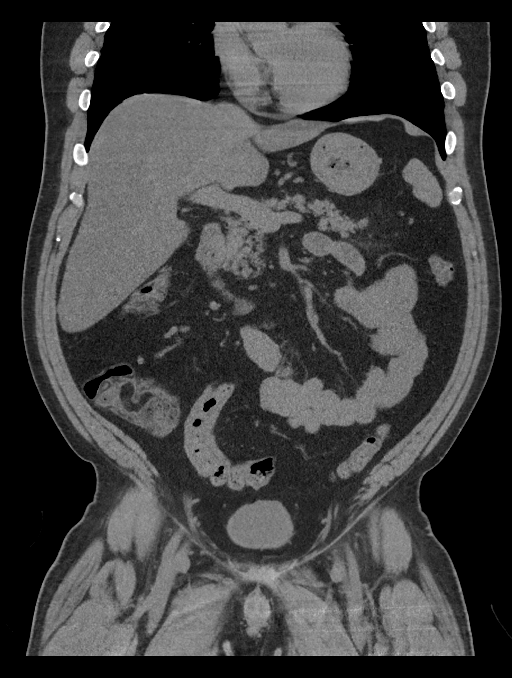
[im 96/172  soft-tissue]
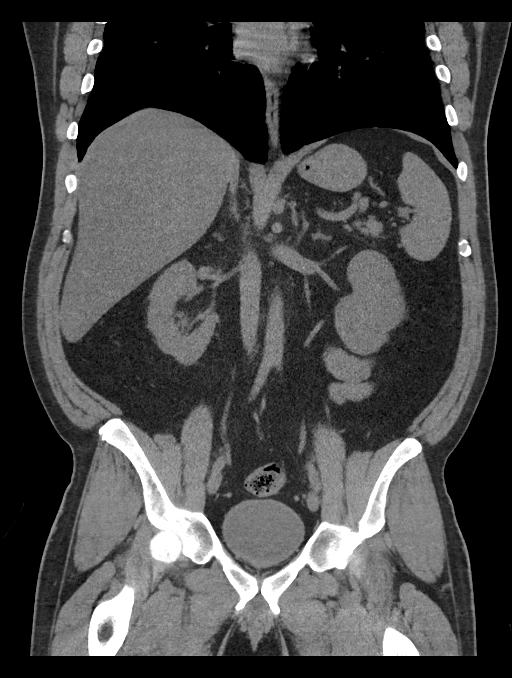

[15 of 46 positions shown; findings below may reference images not displayed]

FINDINGS: Lower chest: Lung bases are clear.

Hepatobiliary: Liver measures 22.2 cm in length. There is hepatic
steatosis. Gallbladder is absent. There is no biliary duct
dilatation.

Pancreas: There is soft tissue stranding in the mesenteric adjacent
to the tail of the pancreas. The pancreas is not enlarged, and there
is no mass or abnormal fluid. No pancreatic duct dilatation.
Remainder of pancreas appears normal.

Spleen: Spleen measures 13.9 x 11.9 x 6.7 cm with a measured splenic
volume of 554 cubic cm. No focal splenic lesions are evident.

Adrenals/Urinary Tract: Adrenals appear normal bilaterally. Kidneys
bilaterally show no evident mass or hydronephrosis on either side.
There is no appreciable renal or ureteral calculus on either side.
Urinary bladder is midline with wall thickness within normal limits.

Stomach/Bowel: There is no appreciable bowel wall or mesenteric
thickening. There is no appreciable bowel obstruction. No evident
free air or portal venous air. There is lipomatous infiltration of
the ileocecal valve.

Vascular/Lymphatic: There is no abdominal aortic aneurysm. No
evident vascular lesions on this noncontrast enhanced study. There
is no appreciable adenopathy in the abdomen or pelvis.

Reproductive: Prostate and seminal vesicles are normal in size and
contour. There is no pelvic mass.

Other: Appendix appears normal. There is no abscess or ascites in
the abdomen or pelvis.

Musculoskeletal: There are no appreciable blastic or lytic bone
lesions. There is no intramuscular or abdominal wall lesion.
IMPRESSION: 1. Soft tissue stranding in the mesenteric adjacent to the tail of
the pancreas. Suspect early acute pancreatitis in this area.
Appropriate laboratory correlation advised. No pancreatic mass or
fluid collection. No pancreatic duct dilatation.

2. Hepatic and splenic prominence. There is hepatic steatosis.
Gallbladder is absent. No biliary duct dilatation evident.

3.  No bowel obstruction.  No abscess.  Appendix appears normal.

4.  No renal or ureteral calculus.  No evident hydronephrosis.

These results will be called to the ordering clinician or
representative by the Radiologist Assistant, and communication
documented in the PACS or zVision Dashboard.

## 2021-08-06 ENCOUNTER — Encounter: Payer: Self-pay | Admitting: Emergency Medicine

## 2021-08-06 ENCOUNTER — Other Ambulatory Visit: Payer: Self-pay

## 2021-08-06 ENCOUNTER — Ambulatory Visit
Admission: EM | Admit: 2021-08-06 | Discharge: 2021-08-06 | Disposition: A | Discharge status: Home / Self Care | Payer: BC Managed Care – PPO

## 2021-08-06 DIAGNOSIS — J0141 Acute recurrent pansinusitis: Secondary | ICD-10-CM

## 2021-08-06 DIAGNOSIS — J3489 Other specified disorders of nose and nasal sinuses: Secondary | ICD-10-CM

## 2021-08-06 MED ORDER — AMOXICILLIN-POT CLAVULANATE 875-125 MG PO TABS: 1.0000 | ORAL_TABLET | Freq: Two times a day (BID) | ORAL | 0 refills | Status: AC

## 2021-08-06 NOTE — ED Triage Notes (Signed)
Pt here with cough, headache, and nasal drainage x 12 days.

## 2021-08-06 NOTE — Discharge Instructions (Addendum)
Recommend start Augmentin 875mg  twice a day as directed. Continue to increase fluids to help loosen up mucus in sinuses. Use Mucinex every 12 hours as needed. Continue Tylenol 1000mg  every 8 hours as needed for pain. Follow-up with your PCP in 4 to 5 days if not improving.

## 2021-08-06 NOTE — ED Provider Notes (Signed)
Kevin Maynard    CSN: [id number] Arrival date & time: 08/06/21  1217      History   Chief Complaint Chief Complaint  Patient presents with   Cough   Nasal Congestion   Headache    HPI Kevin Maynard is a 45 y.o. male.   45 year old male presents with nasal congestion, headache, fatigue and cough for almost 2 weeks.  Uncertain if he has had a fever.  Denies any nausea or vomiting but has occasional diarrhea.  Has history of chronic sinus issues-usually gets a sinus infection once to twice a year.  Has taken Tylenol and Afrin with no relief.  Unable to take decongestants due to high blood pressure.  Chronic health issues include HTN, hyperlipidemia, type 2 DM, GERD, and sleep apnea. Currently on Lotensin HCT, NovoLog insulin, Lantus, metformin-dapagliflozin, Actos, Protonix, Crestor, fenofibrate, Effexor daily and trazodone prn.   The history is provided by the patient.   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   GERD (gastroesophageal reflux disease)    HLD (hyperlipidemia) 08/06/2014   Obstructive apnea 01/13/2016   Stomach ulcer    Type 2 diabetes mellitus with hyperglycemia ([place]) 08/06/2014    Patient Active Problem List   Diagnosis Date Noted   Hyponatremia    Acute pancreatitis    Hypokalemia    DKA (diabetic ketoacidoses) 07/29/2017   Obstructive apnea 01/13/2016   Controlled type 2 diabetes mellitus without complication ([place]) 16/11/930   Abdominal pain, right upper quadrant 08/06/2014   Type 2 diabetes mellitus with hyperglycemia ([place]) 08/06/2014   HLD (hyperlipidemia) 08/06/2014   BP (high blood pressure) 08/06/2014   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 Authorizing Provider  amoxicillin-clavulanate (AUGMENTIN) 875-125 MG tablet Take 1 tablet by mouth every 12 (twelve) hours for 7 days. 08/06/21 08/13/21 Yes Trinette Vera, Nicholes Stairs, NP  insulin aspart (NOVOLOG FLEXPEN) 100 UNIT/ML FlexPen Novolog Flexpen U-100 Insulin aspart 100 unit/mL (3 mL) subcutaneous  TAKE 16 UNITS THREE TIMES DAILY BEFORE MEALS. 08/05/17  Yes [provider]  pantoprazole (PROTONIX) 40 MG tablet Take 1 tablet by mouth daily. 05/09/17  Yes [provider]  benazepril-hydrochlorthiazide (LOTENSIN HCT) 20-25 MG tablet Take 1 tablet by mouth daily. 12/25/15   [provider]  blood glucose meter kit and supplies KIT Dispense based on patient and insurance preference. Use up to four times daily as directed. (FOR ICD-9 250.00, 250.01). 08/01/17   Gladstone Lighter, MD  fenofibrate 160 MG tablet Take 160 mg by mouth daily.    [provider]  Insulin Glargine (LANTUS SOLOSTAR) 100 UNIT/ML Solostar Pen Inject 30 Units into the skin at bedtime. 08/01/17   Gladstone Lighter, MD  Insulin Pen Needle (PEN NEEDLES) 31G X 6 MM MISC Use as directed 08/01/17   Gladstone Lighter, MD  metFORMIN (GLUCOPHAGE) 500  MG tablet Take 500-1,000 mg by mouth 2 (two) times daily with a meal. 500 mg every morning and 1000 mg at bedtime    [provider]  pioglitazone (ACTOS) 15 MG tablet Take 15 mg by mouth daily. 05/05/21   [provider]  rosuvastatin (CRESTOR) 40 MG tablet Take 40 mg by mouth daily. 08/06/21   [provider]  sertraline (ZOLOFT) 100 MG tablet Take 100 mg by mouth every morning.  07/20/21   [provider]  traZODone (DESYREL) 100 MG tablet Take 100-150 mg by mouth at bedtime as needed. 06/29/21   [provider]  venlafaxine XR (EFFEXOR-XR) 75 MG 24 hr capsule Take 75 mg by mouth daily. 07/20/21   [provider]  XIGDUO XR 03-999 MG TB24 Take 1 tablet by mouth 2 (two) times daily. 07/29/21   [provider]    Family History Family History  Adopted: Yes    Social History Social History   Tobacco Use   Smoking status: Some Days    Packs/day: 0.25    Types: Cigarettes   Smokeless tobacco: Former    Quit date: 07/15/1989  Substance Use Topics   Alcohol use: Yes    Alcohol/week: 0.0 standard drinks    Comment: occasional   Drug use: No     Allergies   Patient has no known allergies.   Review of Systems Review of Systems  Constitutional:  Positive for fatigue. Negative for activity change, appetite change, chills and fever.  HENT:  Positive for congestion, postnasal drip, sinus pressure, sinus pain and sore throat. Negative for ear discharge, ear pain, facial swelling, mouth sores, nosebleeds and trouble swallowing.   Eyes:  Negative for pain, discharge, redness and itching.  Respiratory:  Positive for cough. Negative for chest tightness and shortness of breath.   Gastrointestinal:  Positive for diarrhea. Negative for nausea and vomiting.  Musculoskeletal:  Negative for arthralgias, myalgias, neck pain and neck stiffness.  Skin:  Negative for color change and rash.  Allergic/Immunologic: Negative for environmental allergies and food allergies.  Neurological:  Positive for headaches. Negative for dizziness, tremors, seizures, syncope, speech difficulty, weakness and numbness.  Hematological:  Negative for adenopathy. Does not bruise/bleed easily.    Physical Exam Triage Vital Signs ED Triage Vitals  Enc Vitals Group     BP 08/06/21 1359 114/76     Pulse Rate 08/06/21 1359 73     Resp 08/06/21 1359 18     Temp  08/06/21 1359 98.8 F (37.1 C)     Temp Source 08/06/21 1359 Oral     SpO2 08/06/21 1359 96 %     Weight --      Height --      Head Circumference --      Peak Flow --      Pain Score 08/06/21 1403 4     Pain Loc --      Pain Edu? --      Excl. in [place]? --    No data found.  Updated Vital Signs BP 114/76 (BP Location: Left Arm)   Pulse 73   Temp 98.8 F (37.1 C) (Oral)   Resp 18   SpO2 96%   Visual Acuity Right Eye Distance:   Left Eye Distance:   Bilateral Distance:    Right Eye Near:   Left Eye Near:    Bilateral Near:     Physical Exam Vitals and nursing note reviewed.  Constitutional:      General:  He is awake. He is not in acute distress.    Appearance: He is well-developed and well-groomed. He is ill-appearing.     Comments: He is sitting on the exam table in no acute distress but appears tired and ill.   HENT:     Head: Normocephalic and atraumatic.     Right Ear: Hearing, ear canal and external ear normal. Tympanic membrane is bulging. Tympanic membrane is not injected or erythematous.     Left Ear: Hearing, ear canal and external ear normal. Tympanic membrane is bulging. Tympanic membrane is not injected or erythematous.     Nose: Mucosal edema and congestion present.     Right Sinus: Maxillary sinus tenderness and frontal sinus tenderness present.     Left Sinus: Maxillary sinus tenderness and frontal sinus tenderness present.     Mouth/Throat:     Lips: Pink.     Mouth: Mucous membranes are moist.     Pharynx: Uvula midline. Oropharyngeal exudate and posterior oropharyngeal erythema present. No pharyngeal swelling or uvula swelling.     Comments: Yellow post nasal drainage present.  Eyes:     Extraocular Movements: Extraocular movements intact.     Conjunctiva/sclera: Conjunctivae normal.  Cardiovascular:     Rate and Rhythm: Normal rate and regular rhythm.     Heart sounds: Normal heart sounds. No murmur heard. Pulmonary:     Effort: Pulmonary effort  is normal. No respiratory distress.     Breath sounds: Normal breath sounds and air entry. No decreased air movement. No decreased breath sounds, wheezing, rhonchi or rales.  Musculoskeletal:     Cervical back: Normal range of motion and neck supple.  Lymphadenopathy:     Cervical: No cervical adenopathy.  Skin:    General: Skin is warm and dry.     Capillary Refill: Capillary refill takes less than 2 seconds.     Findings: No rash.  Neurological:     General: No focal deficit present.     Mental Status: He is alert and oriented to person, place, and time.  Psychiatric:        Mood and Affect: Mood normal.        Behavior: Behavior normal. Behavior is cooperative.        Thought Content: Thought content normal.        Judgment: Judgment normal.     UC Treatments / Results  Labs (all labs ordered are listed, but only abnormal results are displayed) Labs Reviewed - No data to display  EKG   Radiology No results found.  Procedures Procedures (including critical care time)  Medications Ordered in UC Medications - No data to display  Initial Impression / Assessment and Plan / UC Course  I have reviewed the triage vital signs and the nursing notes.  Pertinent labs & imaging results that were available during my care of the patient were reviewed by me and considered in my medical decision making (see chart for details).    Reviewed with patient that he appears to have another sinus infection. Will treat with Augmentin 881m twice a day as directed.  Continue to increase fluids to help loosen up mucus in sinuses.  Use plain Mucinex every 12 hours as needed.  Continue Tylenol 1000 mg every 8 hours as needed for pain.  Note written for work. Follow-up with his PCP in 4 to 5 days if not improving. Final Clinical Impressions(s) / UC Diagnoses   Final diagnoses:  Acute recurrent pansinusitis  Sinus pressure  Discharge Instructions      Recommend start Augmentin 852m  twice a day as directed. Continue to increase fluids to help loosen up mucus in sinuses. Use Mucinex every 12 hours as needed. Continue Tylenol 10048mevery 8 hours as needed for pain. Follow-up with your PCP in 4 to 5 days if not improving.      ED Prescriptions     Medication Sig Dispense Auth. Provider   amoxicillin-clavulanate (AUGMENTIN) 875-125 MG tablet Take 1 tablet by mouth every 12 (twelve) hours for 7 days. 14 tablet Yarlin Breisch, AnNicholes StairsNP      PDMP not reviewed this encounter.   AmKaty ApoNP 08/07/21 [phone]
